# Patient Record
Sex: Male | Born: 1935 | Race: White | Hispanic: No | Marital: Married | Smoking: Former smoker
Health system: Southern US, Community
[De-identification: ages and names within clinical notes are randomized; demographics above are authoritative.]

## PROBLEM LIST (undated history)

## (undated) DIAGNOSIS — E039 Hypothyroidism, unspecified: Secondary | ICD-10-CM

## (undated) DIAGNOSIS — I2699 Other pulmonary embolism without acute cor pulmonale: Secondary | ICD-10-CM

## (undated) DIAGNOSIS — K635 Polyp of colon: Secondary | ICD-10-CM

## (undated) DIAGNOSIS — S52209A Unspecified fracture of shaft of unspecified ulna, initial encounter for closed fracture: Secondary | ICD-10-CM

## (undated) DIAGNOSIS — I4891 Unspecified atrial fibrillation: Secondary | ICD-10-CM

## (undated) DIAGNOSIS — I10 Essential (primary) hypertension: Secondary | ICD-10-CM

## (undated) DIAGNOSIS — E785 Hyperlipidemia, unspecified: Secondary | ICD-10-CM

## (undated) DIAGNOSIS — I639 Cerebral infarction, unspecified: Secondary | ICD-10-CM

## (undated) DIAGNOSIS — N183 Chronic kidney disease, stage 3 unspecified: Secondary | ICD-10-CM

## (undated) DIAGNOSIS — J449 Chronic obstructive pulmonary disease, unspecified: Secondary | ICD-10-CM

## (undated) DIAGNOSIS — I272 Pulmonary hypertension, unspecified: Secondary | ICD-10-CM

## (undated) DIAGNOSIS — C61 Malignant neoplasm of prostate: Secondary | ICD-10-CM

## (undated) HISTORY — DX: Hyperlipidemia, unspecified: E78.5

## (undated) HISTORY — DX: Chronic kidney disease, stage 3 unspecified: N18.30

## (undated) HISTORY — PX: APPENDECTOMY: SHX54

## (undated) HISTORY — PX: CHOLECYSTECTOMY: SHX55

## (undated) HISTORY — DX: Cerebral infarction, unspecified: I63.9

## (undated) HISTORY — DX: Unspecified fracture of shaft of unspecified ulna, initial encounter for closed fracture: S52.209A

## (undated) HISTORY — DX: Pulmonary hypertension, unspecified: I27.20

## (undated) HISTORY — DX: Unspecified atrial fibrillation: I48.91

## (undated) HISTORY — PX: SPLENECTOMY: SUR1306

## (undated) HISTORY — DX: Hypothyroidism, unspecified: E03.9

## (undated) HISTORY — DX: Chronic kidney disease, stage 3 (moderate): N18.3

## (undated) HISTORY — PX: OTHER SURGICAL HISTORY: SHX169

## (undated) HISTORY — DX: Malignant neoplasm of prostate: C61

## (undated) HISTORY — PX: CATARACT EXTRACTION: SUR2

## (undated) HISTORY — PX: PROSTATECTOMY: SHX69

## (undated) HISTORY — PX: PARTIAL COLECTOMY: SHX5273

## (undated) HISTORY — DX: Polyp of colon: K63.5

## (undated) HISTORY — PX: TOTAL HIP ARTHROPLASTY: SHX124

---

## 1999-07-20 ENCOUNTER — Ambulatory Visit (HOSPITAL_COMMUNITY): Admission: RE | Admit: 1999-07-20 | Discharge: 1999-07-20 | Payer: Self-pay | Admitting: Pulmonary Disease

## 1999-07-20 ENCOUNTER — Encounter: Payer: Self-pay | Admitting: Pulmonary Disease

## 1999-09-12 ENCOUNTER — Ambulatory Visit: Admission: RE | Admit: 1999-09-12 | Discharge: 1999-09-12 | Payer: Self-pay | Admitting: Pulmonary Disease

## 2001-09-23 DIAGNOSIS — I2699 Other pulmonary embolism without acute cor pulmonale: Secondary | ICD-10-CM

## 2001-09-23 HISTORY — DX: Other pulmonary embolism without acute cor pulmonale: I26.99

## 2008-06-27 ENCOUNTER — Inpatient Hospital Stay (HOSPITAL_COMMUNITY): Admission: RE | Admit: 2008-06-27 | Discharge: 2008-07-01 | Payer: Self-pay | Admitting: Orthopedic Surgery

## 2008-06-27 ENCOUNTER — Ambulatory Visit: Payer: Self-pay | Admitting: Emergency Medicine

## 2009-01-02 ENCOUNTER — Inpatient Hospital Stay (HOSPITAL_COMMUNITY): Admission: RE | Admit: 2009-01-02 | Discharge: 2009-01-05 | Payer: Self-pay | Admitting: Orthopedic Surgery

## 2009-01-10 ENCOUNTER — Encounter (INDEPENDENT_AMBULATORY_CARE_PROVIDER_SITE_OTHER): Payer: Self-pay | Admitting: *Deleted

## 2009-01-10 ENCOUNTER — Ambulatory Visit: Payer: Self-pay | Admitting: *Deleted

## 2009-01-10 ENCOUNTER — Inpatient Hospital Stay (HOSPITAL_COMMUNITY): Admission: EM | Admit: 2009-01-10 | Discharge: 2009-01-12 | Payer: Self-pay | Admitting: Emergency Medicine

## 2009-01-10 ENCOUNTER — Ambulatory Visit: Payer: Self-pay | Admitting: Vascular Surgery

## 2010-09-07 ENCOUNTER — Encounter
Admission: RE | Admit: 2010-09-07 | Discharge: 2010-09-07 | Payer: Self-pay | Source: Home / Self Care | Attending: Orthopedic Surgery | Admitting: Orthopedic Surgery

## 2011-01-02 LAB — TYPE AND SCREEN
ABO/RH(D): O NEG
ABO/RH(D): O NEG
Antibody Screen: NEGATIVE
Antibody Screen: NEGATIVE

## 2011-01-02 LAB — CBC
HCT: 22.9 % — ABNORMAL LOW (ref 39.0–52.0)
HCT: 25.4 % — ABNORMAL LOW (ref 39.0–52.0)
HCT: 27.4 % — ABNORMAL LOW (ref 39.0–52.0)
HCT: 27.5 % — ABNORMAL LOW (ref 39.0–52.0)
HCT: 34.2 % — ABNORMAL LOW (ref 39.0–52.0)
Hemoglobin: 7.9 g/dL — CL (ref 13.0–17.0)
Hemoglobin: 8.4 g/dL — ABNORMAL LOW (ref 13.0–17.0)
Hemoglobin: 11.6 g/dL — ABNORMAL LOW (ref 13.0–17.0)
Hemoglobin: 9.4 g/dL — ABNORMAL LOW (ref 13.0–17.0)
MCHC: 34.2 g/dL (ref 30.0–36.0)
MCHC: 34.4 g/dL (ref 30.0–36.0)
MCHC: 34.9 g/dL (ref 30.0–36.0)
MCV: 98.7 fL (ref 78.0–100.0)
MCV: 100 fL (ref 78.0–100.0)
MCV: 98.4 fL (ref 78.0–100.0)
MCV: 98.9 fL (ref 78.0–100.0)
MCV: 99.5 fL (ref 78.0–100.0)
Platelets: 341 10*3/uL (ref 150–400)
Platelets: 135 10*3/uL — ABNORMAL LOW (ref 150–400)
Platelets: 143 10*3/uL — ABNORMAL LOW (ref 150–400)
RBC: 2.43 MIL/uL — ABNORMAL LOW (ref 4.22–5.81)
RBC: 2.75 MIL/uL — ABNORMAL LOW (ref 4.22–5.81)
RBC: 3.42 MIL/uL — ABNORMAL LOW (ref 4.22–5.81)
RDW: 13.6 % (ref 11.5–15.5)
RDW: 13.9 % (ref 11.5–15.5)
RDW: 14.2 % (ref 11.5–15.5)
RDW: 13.9 % (ref 11.5–15.5)
WBC: 10.1 10*3/uL (ref 4.0–10.5)
WBC: 12.2 10*3/uL — ABNORMAL HIGH (ref 4.0–10.5)
WBC: 13.1 10*3/uL — ABNORMAL HIGH (ref 4.0–10.5)
WBC: 16.3 10*3/uL — ABNORMAL HIGH (ref 4.0–10.5)
WBC: 9.2 10*3/uL (ref 4.0–10.5)

## 2011-01-02 LAB — URINALYSIS, ROUTINE W REFLEX MICROSCOPIC
Bilirubin Urine: NEGATIVE
Glucose, UA: NEGATIVE mg/dL
Hgb urine dipstick: NEGATIVE
Nitrite: NEGATIVE
Nitrite: NEGATIVE
Protein, ur: NEGATIVE mg/dL
Specific Gravity, Urine: 1.011 (ref 1.005–1.030)
Specific Gravity, Urine: 1.01 (ref 1.005–1.030)
Urobilinogen, UA: 0.2 mg/dL (ref 0.0–1.0)
Urobilinogen, UA: 0.2 mg/dL (ref 0.0–1.0)
Urobilinogen, UA: 0.2 mg/dL (ref 0.0–1.0)
pH: 6 (ref 5.0–8.0)

## 2011-01-02 LAB — DIFFERENTIAL
Basophils Absolute: 0 10*3/uL (ref 0.0–0.1)
Basophils Absolute: 0 10*3/uL (ref 0.0–0.1)
Basophils Relative: 0 % (ref 0–1)
Basophils Relative: 1 % (ref 0–1)
Eosinophils Absolute: 0.2 10*3/uL (ref 0.0–0.7)
Lymphocytes Relative: 15 % (ref 12–46)
Lymphs Abs: 2.6 10*3/uL (ref 0.7–4.0)
Monocytes Absolute: 0.8 10*3/uL (ref 0.1–1.0)
Monocytes Absolute: 0.9 10*3/uL (ref 0.1–1.0)
Monocytes Relative: 10 % (ref 3–12)
Neutro Abs: 5.9 10*3/uL (ref 1.7–7.7)
Neutro Abs: 8.9 10*3/uL — ABNORMAL HIGH (ref 1.7–7.7)
Neutrophils Relative %: 70 % (ref 43–77)
Neutrophils Relative %: 80 % — ABNORMAL HIGH (ref 43–77)
Neutrophils Relative %: 58 % (ref 43–77)

## 2011-01-02 LAB — CREATININE, URINE, RANDOM: Creatinine, Urine: 119.3 mg/dL

## 2011-01-02 LAB — POCT CARDIAC MARKERS
CKMB, poc: 2.9 ng/mL (ref 1.0–8.0)
Myoglobin, poc: >500 ng/mL (ref 12–200)
Troponin i, poc: <0.05 ng/mL (ref 0.00–0.09)

## 2011-01-02 LAB — BASIC METABOLIC PANEL
BUN: 89 mg/dL — ABNORMAL HIGH (ref 6–23)
BUN: 28 mg/dL — ABNORMAL HIGH (ref 6–23)
BUN: 32 mg/dL — ABNORMAL HIGH (ref 6–23)
CO2: 23 mEq/L (ref 19–32)
CO2: 24 mEq/L (ref 19–32)
CO2: 24 mEq/L (ref 19–32)
CO2: 27 mEq/L (ref 19–32)
Calcium: 8 mg/dL — ABNORMAL LOW (ref 8.4–10.5)
Calcium: 8.1 mg/dL — ABNORMAL LOW (ref 8.4–10.5)
Calcium: 8.3 mg/dL — ABNORMAL LOW (ref 8.4–10.5)
Calcium: 8.7 mg/dL (ref 8.4–10.5)
Chloride: 111 mEq/L (ref 96–112)
Chloride: 104 mEq/L (ref 96–112)
Chloride: 105 mEq/L (ref 96–112)
Chloride: 108 mEq/L (ref 96–112)
Creatinine, Ser: 2.92 mg/dL — ABNORMAL HIGH (ref 0.4–1.5)
Creatinine, Ser: 3.24 mg/dL — ABNORMAL HIGH (ref 0.4–1.5)
Creatinine, Ser: 1.73 mg/dL — ABNORMAL HIGH (ref 0.4–1.5)
GFR calc Af Amer: 22 mL/min — ABNORMAL LOW (ref >60)
GFR calc Af Amer: 37 mL/min — ABNORMAL LOW (ref >60)
GFR calc Af Amer: 46 mL/min — ABNORMAL LOW (ref >60)
GFR calc Af Amer: 46 mL/min — ABNORMAL LOW (ref >60)
GFR calc non Af Amer: 21 mL/min — ABNORMAL LOW (ref >60)
GFR calc non Af Amer: 31 mL/min — ABNORMAL LOW (ref >60)
GFR calc non Af Amer: 38 mL/min — ABNORMAL LOW (ref >60)
Glucose, Bld: 107 mg/dL — ABNORMAL HIGH (ref 70–99)
Glucose, Bld: 117 mg/dL — ABNORMAL HIGH (ref 70–99)
Glucose, Bld: 140 mg/dL — ABNORMAL HIGH (ref 70–99)
Glucose, Bld: 116 mg/dL — ABNORMAL HIGH (ref 70–99)
Glucose, Bld: 151 mg/dL — ABNORMAL HIGH (ref 70–99)
Potassium: 4.8 mEq/L (ref 3.5–5.1)
Potassium: 4.8 mEq/L (ref 3.5–5.1)
Potassium: 4.8 mEq/L (ref 3.5–5.1)
Potassium: 4.9 mEq/L (ref 3.5–5.1)
Potassium: 4 mEq/L (ref 3.5–5.1)
Potassium: 4.3 mEq/L (ref 3.5–5.1)
Potassium: 4.4 mEq/L (ref 3.5–5.1)
Potassium: 4.8 mEq/L (ref 3.5–5.1)
Sodium: 139 mEq/L (ref 135–145)
Sodium: 139 mEq/L (ref 135–145)
Sodium: 141 mEq/L (ref 135–145)
Sodium: 139 mEq/L (ref 135–145)
Sodium: 140 mEq/L (ref 135–145)
Sodium: 141 mEq/L (ref 135–145)

## 2011-01-02 LAB — SODIUM, URINE, RANDOM: Sodium, Ur: 18 mEq/L

## 2011-01-02 LAB — HEPATIC FUNCTION PANEL
AST: 34 U/L (ref 0–37)
Albumin: 2.8 g/dL — ABNORMAL LOW (ref 3.5–5.2)
Bilirubin, Direct: 0.1 mg/dL (ref 0.0–0.3)

## 2011-01-02 LAB — URINE MICROSCOPIC-ADD ON

## 2011-01-02 LAB — LIPID PANEL
Cholesterol: 85 mg/dL (ref 0–200)
HDL: 23 mg/dL — ABNORMAL LOW (ref >39)

## 2011-01-02 LAB — PROTIME-INR
INR: 1.2 (ref 0.00–1.49)
INR: 1.3 (ref 0.00–1.49)
INR: 1.4 (ref 0.00–1.49)
INR: 1 (ref 0.00–1.49)
Prothrombin Time: 16 seconds — ABNORMAL HIGH (ref 11.6–15.2)
Prothrombin Time: 16.3 seconds — ABNORMAL HIGH (ref 11.6–15.2)

## 2011-01-02 LAB — WOUND CULTURE
Culture: NO GROWTH
Gram Stain: NONE SEEN

## 2011-01-02 LAB — CULTURE, BLOOD (ROUTINE X 2): Culture: NO GROWTH

## 2011-01-02 LAB — APTT: aPTT: 30 seconds (ref 24–37)

## 2011-01-02 LAB — TSH: TSH: 2.136 u[IU]/mL (ref 0.350–4.500)

## 2011-01-02 LAB — BRAIN NATRIURETIC PEPTIDE: Pro B Natriuretic peptide (BNP): 41 pg/mL (ref 0.0–100.0)

## 2011-02-05 NOTE — Discharge Summary (Signed)
NAME:  Manuel [name], Manuel [name] NO.:  [id number]   MEDICAL RECORD NO.:  [id number]          PATIENT TYPE:  INP   LOCATION:  6711                         FACILITY:  [facility]   PHYSICIAN:  Waldemar Dickens, MD     DATE OF BIRTH:  Jan 07, 1922   DATE OF ADMISSION:  01/10/2009  DATE OF DISCHARGE:  01/12/2009                               DISCHARGE SUMMARY   DISCHARGE DIAGNOSIS:  1. Right leg swelling/cellulitis, status post right hip arthroplasty      on January 02, 2009, resolving, to finish a 10-day course of      doxycycline.  2. Anemia secondary to blood loss from the recent surgery, in the      setting of chronic anemia with baseline hemoglobin between 11-12,      hemoglobin at the time of discharge 8.4, to follow up with his PCP.  3. Acute on chronic renal insufficiency, resolving, the patient is to      hold ACE inhibitors, ARBs, Lasix until he sees his primary care      physician, to follow up to get repeat BMP on Monday.  4. Status post right hip arthroplasty, January 02, 2009 by Dr. Turner Daniels, on      Coumadin therapy for DVT prophylaxis.  5. Status post left hip arthroplasty, October 2009 done by Dr. Turner Daniels.  6. Coronary artery disease, stable.  7. Hypertension, ACE inhibitor, ARBs, and Lasix are held and      amlodipine continued.  8. COPD, on home oxygen.  9. History of DVT.  10.History of pulmonary emboli status post IVC filter placement nearly      5 years ago.  11.History of prostate cancer status post surgery.  12.History of cholecystectomy.  13.Hyperlipidemia with an LDL of 47 and HDL of 23.  [id number].   DISCHARGE MEDICATIONS:  1. Doxycycline 100 mg 1 pill twice daily for a total of 10 days.  2. Synthroid 50 mcg 1 pill once daily.  3. Simvastatin 80 mg 1 pill once daily.  4. Centrum Silver 1 pill once daily.  5. Fexofenadine 180 mg 1 pill once daily.  6. Prevacid 30 mg 1 pill once daily.  7. Brovana 15 mcg/ 2 mL nebulization twice daily.  8. Coumadin 4 mg  1 pill once daily, the patient is to check PT/INR on      January 16, 2009 at his PCP.  9. Oxygen via nasal cannula as needed.  10.Oxycodone 5 mg 1 pill every 4 hours as needed for hip pain or leg      pain.  11.Amlodipine 5 mg 1 pill once daily.  12.The patient is advised to stop taking fosinopril until he sees his      primary care physician.  13.The patient is advised not to take furosemide until he sees his      primary care physician.  14.The patient is advised not to take Exforge until he sees his      primary care physician.   The patient is advised to elevate his right leg and increase activity  slowly and is advised to use walker.   DISPOSITION:  Follow-up.  The patient is to follow up with Dr. Foye Deer on  January 16, 2009 at 3:15 p.m.  The patient is to get CBC, BMET, and INR on  the day of appointment.  At the time of hospital follow-up, please make  sure that the patient's hemoglobin is not dropping further.  The  patient's hemoglobin on the day of discharge is 8.4 and was stable.  Make sure that the cellulitis of his right leg is resolving and the  infection is getting better.  Please check his INR and adjust his  Coumadin as needed and also check his BMET to make sure that his BUN and  creatinine are back to his baseline.  His BUN at the time of discharge  is 52 and his creatinine is 1.73.  If the BUN and creatinine are back to  his baseline,  may consider restarting his ACE inhibitor, ARBs, and  Lasix.  The patient is to follow up with his orthopedic doctor Dr. Turner Daniels  as needed.   PROCEDURES PERFORMED:  Right lower extremity Doppler studies are negative.  Chest x-ray, borderline cardiomegaly. Low lung volume suspected  atelectasis medially at the right lung base.  Right hip x-ray. Bilateral total hip replacements with no evidence of  complication.  The surgical staples remain in place, no fractures, no  dislocations no evidence of hardware complications.  The  patient's BNP  on the day of admission is 41.  CBC on the day of admission; white count  11.2, hemoglobin 9.3, hematocrit of 26.6, platelet count 341.  BMET on  the day of admission; sodium 137, potassium 5.21, chloride 104, bicarb  24, glucose 117, BUN 95, creatinine 3.24, calcium 8.7.  Fasting lipid  panel; total cholesterol 85, HDL cholesterol 23, LDL cholesterol 47,  VLDL cholesterol 15.  TSH is 2.113. Hemoglobin A1c 5.5.  Urinalysis is  negative for nitrates and leukocytes.  Preliminary wound cultures and  blood cultures are negative.   HISTORY OF PRESENT ILLNESS:  The patient is an 77 year old gentleman with a past medical history  significant for hypertension, hypothyroidism, chronic renal  insufficiency with creatinine ranging from 1.5 to 2.0, a history of PE  around 5 years ago status post IVC filter placement, history of recent  right hip replacement on January 02, 2009, comes to the emergency with  gradual worsening of his right lower extremity edema.  The patient was  discharged home on January 05, 2009 with home health physical therapy and  occupational therapy.  But patient has been noticing gradual worsening  of his right lower extremity edema.  The patient denies any fever, body  aches, nausea, vomiting, or chest pain.  The patient's wife reports mild  worsening of his shortness of breath.  The patient reports pain in his  right leg, 4/10 in severity, nonradiating, constant, increased by  weightbearing and not allowing him to do his physical therapy.  The  patient on the day prior to admission noticed a blister over the medial  aspect of his right lower leg that started weeping later in the day.  His orthopedic doctor on the day of admission was told to go to the  [facility].  His family reports that he is not drinking water  as he is supposed to.  The patient reports that he does have mild  swelling of his ankles bilaterally for a long time.   PHYSICAL  EXAMINATION:  VITAL SIGNS:  Temperature 98.2, blood pressure 113/47, pulse rate of  74,  respiratory 16, oxygen saturation 90% on room air.  GENERAL EXAMINATION:  The patient was not in any acute distress lying on  the bed.  HEENT:  Eyes:  Pupils equal, round and reacting to light.  ENT: Moist  mucous membranes.  No erythema.  NECK:  Neck is supple.  No JVD.  RESPIRATORY EXAMINATION:  Clear to auscultation bilaterally.  Good air  movement.  CARDIAC EXAMINATION:  S1 and S2 regular in rate and rhythm.  There is  2/6 systolic murmur in the mitral area radiating to the axilla. No rubs,  no gallops.  GI EXAMINATION:  Abdomen is soft, nondistended, and nontender.  No  guarding or rigidity.  Bowel sounds are positive.  Extremities:  Right leg is diffusely swollen from ankle to the hip, leg  erythematous, bullae noted at the medial aspect of his right lower leg,  around 1-2 cms in size, oozing clear fluid.  There is 2+ pitting edema  in the right leg and 1+ pitting edema on the left leg.  No drainage from  the surgical site.  The right leg is warm to touch.  NEUROLOGICAL EXAMINATION:  Limited in the right lower extremity because  of the pain.  Otherwise motor strength is 5/5 in all the extremities.  PSYCH:  Appropriate.   HOSPITAL COURSE:  1. Right leg cellulitis. The patient was discharged from the hospital      on January 05, 2009 after he had a right hip arthroplasty on January 02, 2009.  The patient does have a baseline swelling in his ankles      bilaterally, but he was having mild edema of his right lower      extremity after the surgery and he does have a white count from 13-      16 prior to discharge from the hospital.  Given his erythema,      warmth, edema and his worsening cellulitis we started him on oral      antibiotic doxycycline 100 mg p.o. b.i.d. for a total of 10 days.      We asked him to elevate his legs and the edema gradually started      resolving.  Blood cultures,  urine cultures and wound cultures are      negative as per the preliminary reports.  He is to finish full      course of the doxycycline and is to follow up with his primary care      physician Dr. Tomasa Blase on Monday 25, 2010 at 3:15 p.m. The patient      is also advised to follow up with Dr. Turner Daniels as needed and as      advised.   1. Anemia.  The patient's hemoglobin on the day of admission was 9.3      and he was in prerenal failure with a creatinine of 3.2.  After we      gave him fluids his hemoglobin drop to 7.9, most likely secondary      to dilution.  But his hemoglobin remained stable during the      remaining part of hospital stay with a value of 8.4 at the time of      discharge.  The patient is to follow up with his primary care      doctor and is to get a CBC on January 16, 2009, Monday, to make sure      that his hemoglobin is not falling.  Given the patient's history of      history of DVT, PE, history of recent hip replacement the patient      is at  high risk for DVTs so we continued his Coumadin.  The      patient is to get a PT/INR at the time of hospital follow-up with      Dr. Tomasa Blase and his Coumadin needs to be adjusted accordingly.   1. Acute on chronic renal insufficiency.  The patient does have      chronic renal insufficiency with a baseline creatinine ranging from      1.7 to 1.9.  At the time of admission his creatinine was 3.2 and as      per patient's wife, he was not drinking water atleast few days      prior to admission, on top of which he was taking ACE inhibitors,      angiotensin blockers and Lasix for his blood pressure control.      Given his poor p.o. intake and ACE inhibitor, ARBs, Lasix his acute      on chronic renal insufficiency is most likely secondary to his      prerenal condition and also his BUN creatinine ratio was greater      than 20 on admission.  We started him on IV fluids and held his ACE      inhibitor, ARBs, Lasix.  The patient's  creatinine gradually kept      coming down and on the day of discharge his creatinine is 1.73      which is close to his baseline.  The patient is to follow up with      his primary care physician on January 16, 2009 and is to get a repeat      BMP to make sure that the creatinine is raising anymore.  If the      BUN and creatinine are back to his baseline, we recommend      restarting his ACE inhibitors and Lasix.   1. Hypertension.  The patient's blood pressure during the initial      couple of days of admission was fairly well controlled without any      of his medications on board.  We held his ACE inhibitors, ARBs, and      Lasix secondary to his prerenal azotemia.  On the day of admission      as his systolic blood pressures were touching the 150s we started      him on amlodipine 5 mg.  The patient is to follow up with his      primary care physician and he is advised not to take ACE inhibitor,      ARBs, and Lasix until he sees the primary care physician and is      advised to take amlodipine 5 mg once a day..  Given the acute renal      insufficiency is resolved at the time of hospital follow-up may      consider restarting his ACE inhibitors and Lasix.   1. COPD, stable.  2. Hyperlipidemia. We continued his home medication.  3. Coronary artery disease, stable.   Vitals on the day of discharge, temperature 97.9, pulse rate of 67,  respiratory 20, blood pressure 151/62, oxygen saturation 96% on 3  liters.   Labs on the day of discharge; CBC:  White count 10.1, hemoglobin 8.4,  hematocrit 24.0, platelet count 400.  Basic metabolic panel:  Sodium  141, potassium 4.8, chloride 114, bicarb 23, glucose 112, BUN 52,  creatinine 1.70, calcium 8.3.   On the day of discharge the patient is alert and oriented x3, stable,  asymptomatic, is not in any distress.   The patient is to follow up with his primary care physician Dr. Tomasa Blase  on January 16, 2009 at 3:15 p.m. to get a repeat CBC,  basic metabolic  panel and PT/INRs.      Blondell Reveal, MD  Electronically Signed      Waldemar Dickens, MD  Electronically Signed    VB/MEDQ  D:  01/12/2009  T:  01/12/2009  Job:  [id number]   cc:   Foye Deer, MD  Feliberto Gottron. Turner Daniels, M.D.

## 2011-02-05 NOTE — Op Note (Signed)
NAME:  Manuel [name], Manuel [name] NO.:  [id number]   MEDICAL RECORD NO.:  [id number]          PATIENT TYPE:  INP   LOCATION:  2603                         FACILITY:  [facility]   PHYSICIAN:  Feliberto Gottron. Turner Daniels, M.D.   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   PROCEDURE:  Right total hip arthroplasty using DePuy, 52-mm ASR, and  NK+5 ultimate, #4 Summit stem with a 13-mm tip and a #3 cement  restricter.  Double batch of DePuy, one cement with 1500 mg of Zinacef.   SURGEON:  Feliberto Gottron. Turner Daniels, MD   FIRST ASSISTANT:  [name], PA-C   ANESTHETIC:  General endotracheal.   ESTIMATED BLOOD LOSS:  300 mL.   FLUID REPLACEMENT:  1200 mL of crystalloid.   DRAINS PLACED:  Foley catheter.   URINE OUTPUT:  300 mL.   INDICATIONS FOR PROCEDURE:  An 77 year old male with bilateral hip AVN,  who underwent a successful left total hip arthroplasty back in September  2009, with basically the exact same components and now desires right  total hip arthroplasty to decrease pain and increase function.  He has  AVN of the right hip with a collapse of the femoral head.  Risks and  benefits of the surgery discussed, and well-known to the patient.   DESCRIPTION OF PROCEDURE:  The patient identified by armband and  received preoperative antibiotics in the holding area at [facility].  He was then taken to operating room 5, appropriate site  monitors were attached and general endotracheal anesthesia induced with  the patient in supine position.  He was then rolled into the left  lateral decubitus position fixed it with a Hulbert Mark II pelvic clamp,  after a Foley catheter was inserted and the right lower extremity  prepped, draped in sterile fashion from the ankle to the hemipelvis.  Standard time-out procedure performed and then the lateral hip and thigh  were infiltrated with 20 mL of 0.5% Marcaine and epinephrine solution  along the track of a posterolateral approach to the hip.  We then made a  15 cm incision centered over the greater trochanter allowing  posterolateral approach to the hip joint to the skin and subcutaneous  tissue down to the level of the IT band, which was then cut in line with  the skin incision over the greater trochanter.  We split the fibers of  the tensor fascia lata exposing the greater trochanter.  Cobra  retractors were then placed between the gluteus minimus and the inferior  hip joint capsule in between the gluteus minimus and the superior hip  joint capsule in the quadratus femoris and the inferior hip joint  capsule.  This exposed the piriformis and short external rotators, which  were then tagged with a #2 Ethibond suture and cut off at the insertion  on  the intertrochanteric crest exposing the hip joint capsule, which was  then developed into an acetabular based flap going from posterior-  superior out over the femoral neck and then out inferior-posterior and  taken this flap to #2 Ethibond sutures.  This exposed the femoral head,  which did have of collapse from AVN.  Bleeders along the  intertrochanteric crest were cauterized.  The hip was flexed and  internally rotated dislocating the femoral head and a standard neck cut  performed one fingerbreadth above the lesser trochanter with the  oscillating saw at the same angle as the stem.  The proximal femur was  then translated anteriorly using a Hohmann retractor for lowering off  the anterior column.  A cobra retractor spiked and was placed in the  cotyloid notch and the posterior inferior wing retractor was placed in  the junction of the ischium and the acetabulum.  This allowed [place] to  excise the labrum with the  electrocautery.  We then reamed the  acetabulum up to 51-mm basket reamer obtaining good coverage in all  quadrants and then irrigated out with normal saline solution.  The edge  was then reamed with 52 mm reamer and a 52 mm ASR cup was inserted in 45  degrees of abduction and 20 degrees of anteversion obtaining good fit  and fill with a solid locking to the acetabulum.  Peripheral osteophytes  were then removed, it was flexed, internally rotated exposing the  proximal femur, which was then entered with the initiating reamer  followed by the lateral reamer and tapered reaming up to a 5.4 taper  reamer and broaching up to a #4 broach.  This was the same size used on  the contralateral side.  We lightly milled the calcar, performed a trial  reduction with NK+2 and NK+5 trial heads obtaining excellent stability  with a +5 to flexion of 90 internal rotation at 70, and external  rotation and extension, the hip could not be dislocated.  At this point,  the trial components were removed, a #3 cement restricter was hammered  in the appropriate depth and the femoral canal, which was then water  picked clean and dry with suction sponges.  A double batch of DePuy one  cement with 1500 mg of Zinacef was then mixed and inserted into the  femoral canal under pressure followed by a #4 Summit stem in about 20  degrees of anteversion in relation to the tibia.  Excess cement was  removed as the cement cured, the stem was held in position.  An NK+5  ultimate head was then hammered onto the stem.  The hip once again  reduced and stability noted be excellent.  At this point, the wound was  once again irrigated out with normal saline solution.  The capsular flap  and a short external rotators were repaired back to the  intertrochanteric crest through drill holes with a #2 Ethibond suture.  The IT band closed with running #1 Vicryl suture.  The subcutaneous  tissue with 0 and 2-0 undyed Vicryl suture and the  skin with running  interlocking 3-0 nylon suture.  A dressing of Xeroform and Mepilex was  then applied.  The patient was unclamped, rolled supine, awakened and  taken to recovery room without difficulty.      Feliberto Gottron. Turner Daniels, M.D.  Electronically Signed     FJR/MEDQ  D:  01/02/2009  T:  01/03/2009  Job:  [id number]

## 2011-02-05 NOTE — Discharge Summary (Signed)
NAME:  Manuel [name], Manuel [name] NO.:  [id number]   MEDICAL RECORD NO.:  [id number]          PATIENT TYPE:  INP   LOCATION:  5001                         FACILITY:  [facility]   PHYSICIAN:  Feliberto Gottron. Turner [name], M.D.   DATE OF BIRTH:  Jan 29, 1922   DATE OF ADMISSION:  06/27/2008  DATE OF DISCHARGE:  07/01/2008                               DISCHARGE SUMMARY   CHIEF COMPLAINT:  Left hip pain.   HISTORY OF PRESENT ILLNESS:  This is an 77 year old gentleman who  complains of unremitting pain in his left hip despite conservative  treatment with antiinflammatory medicines, narcotic pain medicines, and  steroid injections.  He desires a surgical intervention at this time.  All risks and benefits of surgery were discussed with the patient.   PAST MEDICAL HISTORY:  Significant for:  1. Coronary artery disease.  2. Hypertension.  3. COPD.  He is oxygen-dependent.  4. Renal insufficiency.  5. Hyperlipidemia.  6. DVT.  7. Pulmonary embolism status post IVC filter placement.   PAST SURGICAL HISTORY:  Significant for:  1. Prostate surgery.  2. Cholecystectomy.   SOCIAL HISTORY:  He quit smoking 50 years ago.  He denies alcohol use.   FAMILY HISTORY:  Noncontributory.   ALLERGIES:  He has no known drug allergies.   CURRENT MEDICATIONS:  1. Synthroid 0.05 mg 1 p.o. daily.  2. Prevacid 30 mg 1 p.o. daily.  3. Fexofenadine 180 mg 1 p.o. daily.  4. Furosemide 40 mg 1 p.o. daily.  5. Fosinopril 40 mg 1 p.o. daily.  6. Zetia 10 mg 1 p.o. daily.  7. Amlodipine 10 mg 1 p.o. daily.  8. Brovana 15 mcg/Sepracor 1 p.o. b.i.d.   PHYSICAL EXAMINATION:  Gross examination of the left hip demonstrates  the patient to have tenderness with internal rotation and a positive  foot tap.  He walks with an antalgic gait.  He is neurovascularly  intact.   X-rays demonstrate end-stage degenerative joint disease of the left hip.   PREOPERATIVE LABORATORY DATA:  White blood cells 8.1, red blood cells  3.54, hemoglobin 11.9, hematocrit 35.4, platelets 209, sodium 142,  potassium 4.0, chloride 109, glucose 103, BUN 29, creatinine 1.62, PT  13.3, INR 1.0, PTT 29.  Urinalysis was within normal limits.   HOSPITAL COURSE:  Manuel [name] was admitted to [facility] on  June 27, 2008, when he underwent a left total hip arthroplasty using  the DePuy system.  A perioperative Foley catheter was placed as the  surgery was performed by Manuel [name], and the patient tolerated the  procedure well.  He was placed on a Stepdown Unit for close monitoring.  On the first postoperative day, the patient was awake and alert.  He  reported no shortness of breath on 3 L of oxygen.  His surgical dressing  was clean and dry.  He was transferred to the Orthopedic Unit where he  was maintained on 3 L of oxygen.  He was evaluated by Physical Therapy  on this day and was able to ambulate 30 feet.  On the second  postoperative day, the patient complained of  constipation.  He continued  to deny any shortness of breath.  He was again able to ambulate 30 feet  with Physical Therapy.  His hemoglobin was 9.5.  On the third  postoperative day, the patient's hemoglobin was 8.8, and he complained  of fatigue and shortness of breath that made his physical therapy  difficult.  He reported that he was able to have a bowel movement on the  previous day after suppository.  His creatinine had increased to 2.15,  so he was given IV fluids.  He was also given 1 unit of packed red blood  cells.  On the fourth postoperative day, the patient's hemoglobin had  increased to 9.9, and his creatinine had decreased to 1.88.  He was  eating well and ambulating independently with physical therapy.  He was  able to walk 200 feet with therapy.  He was passing stool and flatus  without difficulty.  His incision was benign, and his surgical dressing  was partially soaked with dry blood.  He was discharged home on this  day.    DISPOSITION:  The patient was discharged home on July 01, 2008.  Manuel [name] will manage his wound, Coumadin, and Physical Therapy.  He will  return to the clinic to see Manuel [name] in 1 week.  He is weightbearing as  tolerated with his walker.  His discharge medicines will be as per the  HMR with the addition of Percocet 5 mg tablet 1-2 tab p.o. q.4 h. p.r.n.  pain and Coumadin 5 mg tablet take as directed.   FINAL DIAGNOSIS:  Left hip end-stage degenerative joint disease.   SECONDARY DIAGNOSES:  1. Acute blood loss anemia.  2. Acute renal failure.      Manuel Harris, PA      Feliberto Gottron. Turner [name], M.D.  Electronically Signed    JW/MEDQ  D:  07/01/2008  T:  07/02/2008  Job:  [id number]

## 2011-02-05 NOTE — Op Note (Signed)
NAME:  Manuel [name], Manuel [name] NO.:  [id number]   MEDICAL RECORD NO.:  [id number]          PATIENT TYPE:  INP   LOCATION:  NA                           FACILITY:  [facility]   PHYSICIAN:  Feliberto Gottron. Turner Daniels, M.D.   DATE OF BIRTH:  01-23-1922   DATE OF PROCEDURE:  DATE OF DISCHARGE:                               OPERATIVE REPORT   PREOPERATIVE DIAGNOSIS:  End-stage arthritis of left hip secondary to  avascular necrosis.   POSTOPERATIVE DIAGNOSIS:  End-stage arthritis of left hip secondary to  avascular necrosis.   PROCEDURE:  Left total hip arthroplasty, hybrid using a DePuy 52-mm ASR  shell, NK +0 46-mm Ultimate head, #4 Summit cemented stem with a 12-mm  tip, and a #3 cement restrictor.  Double batch of DePuy HV cement with  1500 mg of Zinacef mixed in the cement.   SURGEON:  Feliberto Gottron.  Turner Daniels, MD   FIRST ASSISTANT:  [name], PA-C   ANESTHETIC:  General endotracheal.   ESTIMATED BLOOD LOSS:  250 mL.   FLUID REPLACEMENT:  1500 mL of crystalloid.   DRAINS:  Foley catheter.   URINE OUTPUT:  300 mL.   ANTIBIOTICS:  He did receive 1 gram of Ancef preoperatively.   INDICATIONS FOR PROCEDURE:  An 77 year old man with end-stage arthritis  of his left hip secondary to AVN who has failed conservative treatment  with a walker, anti-inflammatory medicines, narcotics, attempts at  exercises, and plain radiographs show subchondral collapse of the  femoral head and fairly global AVN.  He desires elective left total hip  arthroplasty.  Risks and benefits of surgery discussed, questions  answered where he a hybrid design because of his age and in an attempt  to minimize his blood loss.  He is an oxygen-dependent COPD patient who  also has a history of DVTs and has an IVC filter in place and carries  significant perioperative risk.   DESCRIPTION OF PROCEDURE:  The patient was identified by armband, taken  the operating room at [facility] where the appropriate site  monitors were attached and general endotracheal anesthesia induced with  the patient in supine position.  A Foley catheter was inserted.  He  received preoperative IV antibiotics and then was placed in the right  lateral decubitus position and fixed there with a Stulberg Mark II  pelvic clamp.  The left lower extremity was then prepped and draped in  the usual sterile fashion from the ankle to the hemipelvis.  The skin  along the lateral hip and thigh infiltrated with 20 mL of 0.5% Marcaine  and epinephrine solution and a time-out procedure was performed.  We  then made a posterolateral approach to the hip joint centered over the  greater trochanter about 15 cm in length cutting through the skin and  subcutaneous tissue down to the IT band which was then cut in line with  the skin incision.  This exposed the greater trochanter and a Cobra  retractor was placed between the gluteus minimus and superior hip joint  capsule and between the quadratus femoris and the inferior hip joint  capsule.  This isolated the piriformis and short external rotators which  were then cut off their insertion on the intertrochanteric crest  exposing the posterior aspect of the hip joint capsule.  This was  likewise developed into an acetabular based flap going from posterior-  superior on the acetabulum out over the insertion on the femoral neck  and then down inferiorly and back out posteriorly to the acetabulum.  This was tagged with two #2 Ethibond sutures.  The hip was flexed and  internally rotated dislocating the arthritic femoral head which did have  some subchondral collapse of the bone.  A standard neck cut was then  performed 1 fingerbreadth above the lesser trochanter with the  oscillating saw and the femoral head was removed.  The proximal femur  was then translated anteriorly in relation to the pelvis levering off  the anterior column with the superior Homan retractor.  A Cobra  retractor was placed  in the cotyloid notch and a posterior inferior wing  retractor was placed.  This exposed the labrum which was then cut off  from the insertion on the acetabular rim and we then sequentially reamed  up to a 51-mm basket reamer obtaining good coverage in all quadrants and  just touched the lip of the acetabulum to 52 reamer.  The acetabulum was  then irrigated out with normal saline solution and a 52-mm ASR cup was  hammered into place obtaining good fit and fill in all quadrants.  The  hip was flexed and internally rotated exposing the proximal femur which  was then entered with the initiating reamer followed by the lateral  reamer and axial reaming up to a 4-5 axial reamer followed by broaching  up to a #4 broach which had the correct size fit and fill for the femur.  With the broach in place, a standard neck was then placed on the broach  and an NK +0 46-mm trial was placed on the broach and neck and the hip  reduced.  Stability of 90 degrees flexion with 70 of internal rotation  was noted in extension with the knee flexed.  The patient could not be  dislocated in external rotation.  At this point, the trial components  were removed.  We sized for a #3 cement restrictor which was then  inserted to the appropriate depth and the proximal femur was then pulse  lavaged, cleaned, and dried with suction and sponges.  Meanwhile a  double batch of DePuy HV cement was mixed with 1500 mg of Zinacef and  loaded into the cement gun and injected into the proximal femur under  pressure followed by a #4 Summit stem.  Excess cement was removed.  The  stem was held in about 20 degrees of anteversion in relation of the  epicondylar axis and after the cement had cured, an NK +0 46-mm Ultimate  ball was hammered on the stem and the hip once again reduced and  stability noted to be excellent.  The wound was then pulse lavaged with  irrigation.  The capsular flap and short external rotators were repaired  back  to the intertrochanteric crest through drill holes with a #2  Ethibond suture.  The IT band was closed with running #1 Vicryl suture,  the subcutaneous tissue with 0 and 2-0 undyed Vicryl suture, and the  skin with running interlocking 3-0 nylon suture.  A dressing of Xeroform  and Mepilex was then applied.  The patient was unclamped, rolled supine,  awakened, and taken to the recovery room without difficulty.      Feliberto Gottron. Turner Daniels, M.D.  Electronically Signed     FJR/MEDQ  D:  06/27/2008  T:  06/27/2008  Job:  [id number]

## 2011-02-08 NOTE — Discharge Summary (Signed)
NAME:  Manuel [name], Manuel [name] NO.:  [id number]   MEDICAL RECORD NO.:  [id number]          PATIENT TYPE:  INP   LOCATION:  5037                         FACILITY:  [facility]   PHYSICIAN:  Feliberto Gottron. Turner Daniels, M.D.   DATE OF BIRTH:  Feb 23, 1922   DATE OF ADMISSION:  01/02/2009  DATE OF DISCHARGE:  01/05/2009                               DISCHARGE SUMMARY   CHIEF COMPLAINT:  Right hip pain.   HISTORY OF PRESENT ILLNESS:  This is an 77 year old gentleman who  complains of severe unremitting pain in his right hip despite  conservative treatment with steroid injections, narcotics, and anti-  inflammatories.  He desires a surgical intervention at this time.  All  risks and benefits of surgery were discussed with the patient's.   PAST MEDICAL HISTORY:  1. Coronary artery disease.  2. Hypertension.  3. COPD.  4. Renal insufficiency.  5. Hyperlipidemia.  6. DVT.  7. Pulmonary embolism.   PAST SURGICAL HISTORY:  He had a left total hip arthroplasty in 2009.  He has had prostate surgery and cholecystectomy.   SOCIAL HISTORY:  He quit smoking 50 years ago and denies the use of  alcohol.   FAMILY HISTORY:  Noncontributory.   ALLERGIES:  He has no known drug allergies.   CURRENT MEDICATIONS:  1. Synthroid 0.5 mg one p.o. daily.  2. Prevacid 30 mg one p.o. daily.  3. Fexofenadine 180 mg one p.o. daily.  4. Furosemide 80 mg one p.o. daily.  5. Simvastatin 80 mg one p.o. daily.  6. Exforge 5 mg/160 mg one p.o. daily.  7. Fosinopril 40 mg one p.o. daily.  8. Brovana 15 mcg one puff b.i.d.  9. Bumetone 500 mg one p.o. b.i.d.  10.Vitamin B12 injection twice monthly.   PHYSICAL EXAMINATION:  Gross examination of the right hip demonstrates  that the patient has tenderness with internal and external rotation.  He  has a positive foot tap and is neurovascularly intact.  X-rays  demonstrates bone-on-bone degenerative joint disease of the right hip.   PREOP LABS:  White blood cells 9.2,  red blood cells 3.42, hemoglobin  11.6, hematocrit 34.2, platelets 200.  PT 12.8, INR 1.0, PTT 30.  Sodium  141, potassium 4.3, chloride 104, glucose 96, BUN 32, creatinine 1.73.  Urinalysis was within normal limits.   HOSPITAL COURSE:  Manuel [name] was admitted to [facility] on January 02, 2009 when he underwent right total hip arthroplasty.  The procedure  was performed by Dr. Gean Birchwood and the patient tolerated it well.  A  perioperative Foley catheter was placed.  The patient was transferred to  the Orthopedic floor in good condition and placed on Lovenox and  Coumadin for DVT prophylaxis.  On the first postoperative day, the  patient was awake and alert and tolerating p.o. intake.  He reported  that his hip pain was well controlled.  Hemoglobin was 9.3 and surgical  dressing was partially soaked.  He was transferred from the step-down to  the Orthopedic floor.  Foley catheter was discontinued.  On the second  postoperative day, the patient was tolerating  p.o. intake and passing  stool and flatus.  He was working well with physical therapy.  Hemoglobin was 8.8 and he complained of some shortness of breath, so he  was transfused with 1 unit of packed red blood cells.  On the third  postoperative day, the patient was doing well and progressing well with  physical therapy.  He passed all of his goals and was discharged home.   DISPOSITION:  The patient was discharged on January 05, 2009.   The discharge medicines were as per the HMR with the addition of Vicodin  5 mg one p.o. q.4-6 h. p.r.n. pain and Coumadin dosed by the pharmacy to  take as directed with a target INR of 1.5 to 2.  He was weightbearing as  tolerated and will return to the clinic in 7-10 days for x-rays and  suture removal.  Home health care would manage his wound, Coumadin, and  physical therapy.   FINAL DIAGNOSIS:  End-stage degenerative joint disease of the right hip  with a secondary diagnosis of acute blood  loss anemia.      [name], PA      Feliberto Gottron. Turner Daniels, M.D.  Electronically Signed    JW/MEDQ  D:  03/24/2009  T:  03/24/2009  Job:  [id number]

## 2011-06-24 LAB — TYPE AND SCREEN: ABO/RH(D): O NEG

## 2011-06-24 LAB — CBC
HCT: 35.4 — ABNORMAL LOW
Hemoglobin: 11.9 — ABNORMAL LOW
MCHC: 33.6
MCV: 100
RBC: 3.54 — ABNORMAL LOW

## 2011-06-24 LAB — COMPREHENSIVE METABOLIC PANEL
BUN: 29 — ABNORMAL HIGH
CO2: 26
Calcium: 9
Chloride: 109
Creatinine, Ser: 1.62 — ABNORMAL HIGH
GFR calc Af Amer: 49 — ABNORMAL LOW
GFR calc non Af Amer: 41 — ABNORMAL LOW
Glucose, Bld: 103 — ABNORMAL HIGH
Total Bilirubin: 0.6

## 2011-06-24 LAB — URINALYSIS, ROUTINE W REFLEX MICROSCOPIC
Glucose, UA: NEGATIVE
Ketones, ur: NEGATIVE
Protein, ur: NEGATIVE
Urobilinogen, UA: 0.2

## 2011-06-24 LAB — DIFFERENTIAL
Basophils Absolute: 0.1
Lymphocytes Relative: 30
Lymphs Abs: 2.4
Neutro Abs: 4.6
Neutrophils Relative %: 56

## 2011-06-24 LAB — PROTIME-INR
INR: 1
Prothrombin Time: 13.3

## 2011-06-25 LAB — CBC
HCT: 26.2 — ABNORMAL LOW
HCT: 29.4 — ABNORMAL LOW
Hemoglobin: 9.8 — ABNORMAL LOW
MCHC: 33.3
MCHC: 34.2
MCV: 100.1 — ABNORMAL HIGH
MCV: 98.4
MCV: 99.2
Platelets: 163
Platelets: 185
RBC: 2.82 — ABNORMAL LOW
RBC: 2.97 — ABNORMAL LOW
RDW: 13.7
RDW: 13.8
RDW: 13.9
WBC: 8.3

## 2011-06-25 LAB — BASIC METABOLIC PANEL
BUN: 16
BUN: 25 — ABNORMAL HIGH
BUN: 44 — ABNORMAL HIGH
CO2: 24
CO2: 26
CO2: 27
Calcium: 7.9 — ABNORMAL LOW
Calcium: 8 — ABNORMAL LOW
Calcium: 8.2 — ABNORMAL LOW
Chloride: 104
Chloride: 110
Chloride: 99
Creatinine, Ser: 1.85 — ABNORMAL HIGH
Creatinine, Ser: 1.88 — ABNORMAL HIGH
Creatinine, Ser: 2.15 — ABNORMAL HIGH
GFR calc Af Amer: 42 — ABNORMAL LOW
GFR calc Af Amer: 42 — ABNORMAL LOW
GFR calc Af Amer: 57 — ABNORMAL LOW
GFR calc non Af Amer: 29 — ABNORMAL LOW
GFR calc non Af Amer: 34 — ABNORMAL LOW
Glucose, Bld: 104 — ABNORMAL HIGH
Glucose, Bld: 120 — ABNORMAL HIGH
Glucose, Bld: 136 — ABNORMAL HIGH
Glucose, Bld: 140 — ABNORMAL HIGH
Potassium: 4.1
Potassium: 4.1
Sodium: 136
Sodium: 141

## 2011-06-25 LAB — PROTIME-INR
INR: 1.4
Prothrombin Time: 16.7 — ABNORMAL HIGH
Prothrombin Time: 17.7 — ABNORMAL HIGH
Prothrombin Time: 17.8 — ABNORMAL HIGH

## 2011-06-25 LAB — CROSSMATCH: ABO/RH(D): O NEG

## 2012-05-09 ENCOUNTER — Emergency Department (HOSPITAL_COMMUNITY): Payer: Medicare Other

## 2012-05-09 ENCOUNTER — Encounter (HOSPITAL_COMMUNITY): Payer: Self-pay | Admitting: *Deleted

## 2012-05-09 ENCOUNTER — Observation Stay (HOSPITAL_COMMUNITY): Payer: Medicare Other

## 2012-05-09 ENCOUNTER — Observation Stay (HOSPITAL_COMMUNITY)
Admission: EM | Admit: 2012-05-09 | Discharge: 2012-05-11 | Disposition: A | Discharge status: Home / Self Care | Payer: Medicare Other | Attending: Internal Medicine | Admitting: Internal Medicine

## 2012-05-09 DIAGNOSIS — Z9849 Cataract extraction status, unspecified eye: Secondary | ICD-10-CM

## 2012-05-09 DIAGNOSIS — E785 Hyperlipidemia, unspecified: Secondary | ICD-10-CM | POA: Diagnosis not present

## 2012-05-09 DIAGNOSIS — R000 Tachycardia, unspecified: Secondary | ICD-10-CM | POA: Diagnosis present

## 2012-05-09 DIAGNOSIS — I2699 Other pulmonary embolism without acute cor pulmonale: Secondary | ICD-10-CM | POA: Diagnosis present

## 2012-05-09 DIAGNOSIS — I129 Hypertensive chronic kidney disease with stage 1 through stage 4 chronic kidney disease, or unspecified chronic kidney disease: Secondary | ICD-10-CM | POA: Insufficient documentation

## 2012-05-09 DIAGNOSIS — I6350 Cerebral infarction due to unspecified occlusion or stenosis of unspecified cerebral artery: Principal | ICD-10-CM | POA: Insufficient documentation

## 2012-05-09 DIAGNOSIS — Z96649 Presence of unspecified artificial hip joint: Secondary | ICD-10-CM | POA: Insufficient documentation

## 2012-05-09 DIAGNOSIS — I639 Cerebral infarction, unspecified: Secondary | ICD-10-CM | POA: Diagnosis present

## 2012-05-09 DIAGNOSIS — I509 Heart failure, unspecified: Secondary | ICD-10-CM | POA: Insufficient documentation

## 2012-05-09 DIAGNOSIS — I10 Essential (primary) hypertension: Secondary | ICD-10-CM | POA: Diagnosis present

## 2012-05-09 DIAGNOSIS — J449 Chronic obstructive pulmonary disease, unspecified: Secondary | ICD-10-CM | POA: Diagnosis present

## 2012-05-09 DIAGNOSIS — E039 Hypothyroidism, unspecified: Secondary | ICD-10-CM | POA: Diagnosis present

## 2012-05-09 DIAGNOSIS — Z86711 Personal history of pulmonary embolism: Secondary | ICD-10-CM | POA: Insufficient documentation

## 2012-05-09 DIAGNOSIS — C61 Malignant neoplasm of prostate: Secondary | ICD-10-CM | POA: Diagnosis not present

## 2012-05-09 DIAGNOSIS — N189 Chronic kidney disease, unspecified: Secondary | ICD-10-CM | POA: Diagnosis present

## 2012-05-09 DIAGNOSIS — G459 Transient cerebral ischemic attack, unspecified: Secondary | ICD-10-CM

## 2012-05-09 DIAGNOSIS — I5042 Chronic combined systolic (congestive) and diastolic (congestive) heart failure: Secondary | ICD-10-CM | POA: Diagnosis present

## 2012-05-09 DIAGNOSIS — Z96643 Presence of artificial hip joint, bilateral: Secondary | ICD-10-CM

## 2012-05-09 DIAGNOSIS — I4891 Unspecified atrial fibrillation: Secondary | ICD-10-CM | POA: Diagnosis present

## 2012-05-09 DIAGNOSIS — J4489 Other specified chronic obstructive pulmonary disease: Secondary | ICD-10-CM | POA: Diagnosis present

## 2012-05-09 HISTORY — DX: Chronic obstructive pulmonary disease, unspecified: J44.9

## 2012-05-09 HISTORY — DX: Other pulmonary embolism without acute cor pulmonale: I26.99

## 2012-05-09 HISTORY — DX: Essential (primary) hypertension: I10

## 2012-05-09 LAB — COMPREHENSIVE METABOLIC PANEL
AST: 27 U/L (ref 0–37)
BUN: 32 mg/dL — ABNORMAL HIGH (ref 6–23)
CO2: 25 mEq/L (ref 19–32)
Calcium: 8.9 mg/dL (ref 8.4–10.5)
Chloride: 108 mEq/L (ref 96–112)
Creatinine, Ser: 1.59 mg/dL — ABNORMAL HIGH (ref 0.50–1.35)
GFR calc Af Amer: 43 mL/min — ABNORMAL LOW (ref >90)
GFR calc non Af Amer: 37 mL/min — ABNORMAL LOW (ref >90)
Total Bilirubin: 0.3 mg/dL (ref 0.3–1.2)

## 2012-05-09 LAB — HEMOGLOBIN A1C: Hgb A1c MFr Bld: 6.2 % — ABNORMAL HIGH (ref <5.7)

## 2012-05-09 LAB — CBC
Hemoglobin: 13.2 g/dL (ref 13.0–17.0)
MCH: 34.5 pg — ABNORMAL HIGH (ref 26.0–34.0)
MCHC: 34.1 g/dL (ref 30.0–36.0)
MCV: 101 fL — ABNORMAL HIGH (ref 78.0–100.0)
RBC: 3.83 MIL/uL — ABNORMAL LOW (ref 4.22–5.81)

## 2012-05-09 LAB — CBC WITH DIFFERENTIAL/PLATELET
Basophils Absolute: 0 10*3/uL (ref 0.0–0.1)
Eosinophils Relative: 1 % (ref 0–5)
HCT: 39.1 % (ref 39.0–52.0)
Hemoglobin: 13.1 g/dL (ref 13.0–17.0)
Lymphocytes Relative: 22 % (ref 12–46)
MCHC: 33.5 g/dL (ref 30.0–36.0)
MCV: 102.6 fL — ABNORMAL HIGH (ref 78.0–100.0)
Monocytes Absolute: 0.9 10*3/uL (ref 0.1–1.0)
Monocytes Relative: 10 % (ref 3–12)
RDW: 14 % (ref 11.5–15.5)
WBC: 9.6 10*3/uL (ref 4.0–10.5)

## 2012-05-09 LAB — PRO B NATRIURETIC PEPTIDE: Pro B Natriuretic peptide (BNP): 2379 pg/mL — ABNORMAL HIGH (ref 0–450)

## 2012-05-09 LAB — CARDIAC PANEL(CRET KIN+CKTOT+MB+TROPI)
CK, MB: 3.4 ng/mL (ref 0.3–4.0)
Relative Index: 2 (ref 0.0–2.5)
Total CK: 173 U/L (ref 7–232)
Troponin I: <0.3 ng/mL (ref <0.30)

## 2012-05-09 LAB — CREATININE, SERUM: Creatinine, Ser: 1.6 mg/dL — ABNORMAL HIGH (ref 0.50–1.35)

## 2012-05-09 MED ORDER — ACETAMINOPHEN 325 MG PO TABS: 650.0000 mg | ORAL_TABLET | ORAL | Status: DC | PRN

## 2012-05-09 MED ORDER — LORATADINE 10 MG PO TABS
10.0000 mg | ORAL_TABLET | Freq: Every day | ORAL | Status: DC
  Administered 2012-05-10 – 2012-05-11 (×2): 10 mg via ORAL
  Filled 2012-05-09 (×2): qty 1

## 2012-05-09 MED ORDER — OMEGA-3-ACID ETHYL ESTERS 1 G PO CAPS
1.0000 g | ORAL_CAPSULE | Freq: Every day | ORAL | Status: DC
  Administered 2012-05-10 – 2012-05-11 (×2): 1 g via ORAL
  Filled 2012-05-09 (×2): qty 1

## 2012-05-09 MED ORDER — DOCUSATE SODIUM 100 MG PO CAPS
100.0000 mg | ORAL_CAPSULE | Freq: Two times a day (BID) | ORAL | Status: DC
  Administered 2012-05-10: 100 mg via ORAL
  Filled 2012-05-09 (×2): qty 1

## 2012-05-09 MED ORDER — SALINE SPRAY 0.65 % NA SOLN
1.0000 | NASAL | Status: DC | PRN
  Filled 2012-05-09: qty 44

## 2012-05-09 MED ORDER — POLYETHYL GLYCOL-PROPYL GLYCOL 0.4-0.3 % OP SOLN
1.0000 [drp] | Freq: Every day | OPHTHALMIC | Status: DC | PRN
Start: 2012-05-09 — End: 2012-05-09

## 2012-05-09 MED ORDER — SODIUM CHLORIDE 0.9 % IV SOLN
INTRAVENOUS | Status: DC
  Administered 2012-05-09: 11:00:00 via INTRAVENOUS

## 2012-05-09 MED ORDER — POLYVINYL ALCOHOL 1.4 % OP SOLN
1.0000 [drp] | OPHTHALMIC | Status: DC | PRN
  Filled 2012-05-09: qty 15

## 2012-05-09 MED ORDER — HEPARIN SODIUM (PORCINE) 5000 UNIT/ML IJ SOLN
5000.0000 [IU] | Freq: Three times a day (TID) | INTRAMUSCULAR | Status: DC
  Administered 2012-05-09 – 2012-05-10 (×2): 5000 [IU] via SUBCUTANEOUS
  Filled 2012-05-09 (×5): qty 1

## 2012-05-09 MED ORDER — DOCUSATE SODIUM 100 MG PO CAPS: 100.0000 mg | ORAL_CAPSULE | Freq: Two times a day (BID) | ORAL | Status: DC

## 2012-05-09 MED ORDER — OMEGA-3 FATTY ACIDS 1000 MG PO CAPS: 1.0000 g | ORAL_CAPSULE | Freq: Every day | ORAL | Status: DC

## 2012-05-09 MED ORDER — ADULT MULTIVITAMIN W/MINERALS CH
1.0000 | ORAL_TABLET | Freq: Every evening | ORAL | Status: DC
  Administered 2012-05-09 – 2012-05-11 (×3): 1 via ORAL
  Filled 2012-05-09 (×3): qty 1

## 2012-05-09 MED ORDER — ALBUTEROL SULFATE (5 MG/ML) 0.5% IN NEBU: 2.5000 mg | INHALATION_SOLUTION | RESPIRATORY_TRACT | Status: DC | PRN

## 2012-05-09 MED ORDER — FEBUXOSTAT 40 MG PO TABS
80.0000 mg | ORAL_TABLET | Freq: Every evening | ORAL | Status: DC
Start: 2012-05-09 — End: 2012-05-11
  Administered 2012-05-09: 40 mg via ORAL
  Administered 2012-05-10: 80 mg via ORAL
  Administered 2012-05-11: 40 mg via ORAL
  Filled 2012-05-09 (×3): qty 2

## 2012-05-09 MED ORDER — LEVOTHYROXINE SODIUM 75 MCG PO TABS
75.0000 ug | ORAL_TABLET | Freq: Every day | ORAL | Status: DC
  Administered 2012-05-10 – 2012-05-11 (×2): 75 ug via ORAL
  Filled 2012-05-09 (×3): qty 1

## 2012-05-09 MED ORDER — SALINE NASAL SPRAY 0.65 % NA SOLN: 1.0000 | NASAL | Status: DC | PRN

## 2012-05-09 MED ORDER — METOPROLOL TARTRATE 1 MG/ML IV SOLN
5.0000 mg | Freq: Once | INTRAVENOUS | Status: DC
  Filled 2012-05-09: qty 5

## 2012-05-09 MED ORDER — ASPIRIN 325 MG PO TABS
325.0000 mg | ORAL_TABLET | Freq: Every day | ORAL | Status: DC
  Administered 2012-05-09 – 2012-05-10 (×2): 325 mg via ORAL
  Filled 2012-05-09 (×3): qty 1

## 2012-05-09 MED ORDER — BENEFIBER PO PACK: 1.0000 | PACK | Freq: Once | ORAL | Status: DC

## 2012-05-09 MED ORDER — ATORVASTATIN CALCIUM 40 MG PO TABS
40.0000 mg | ORAL_TABLET | Freq: Every day | ORAL | Status: DC
  Administered 2012-05-09 – 2012-05-10 (×2): 40 mg via ORAL
  Filled 2012-05-09 (×3): qty 1

## 2012-05-09 NOTE — Plan of Care (Signed)
Problem: Phase I Progression Outcomes Goal: Pt admitted to Stroke Unit Outcome: Completed/Met Date Met:  05/09/12 Patient admitted to [facility].  Oriented to room and unit protocols. Admission navigator completed.

## 2012-05-09 NOTE — ED Notes (Addendum)
Pt presents to department for evaluation of stroke like symptoms. Wife reports slurred speech, R sided facial droop and R sided arm drift @ 06:30 this morning. Upon arrival pt able to move all extremities and answer questions appropriately. Brace noted to R wrist from recent fracture, therefore pt has decreased grip strength in R hand. Speech clear at the time. Pt speaking complete sentences. Pt reports generalized weakness, but denies pain. He is conscious alert and oriented x4. Wears 3L O2 at home.

## 2012-05-09 NOTE — H&P (Signed)
Hospital Admission Note Date: 05/09/2012  Patient name: Manuel [name] Medical record number: [id number] Date of birth: [birth year] Age: 78 y.o. Gender: male PCP: No primary provider on file.  Medical Service: Internal Medicine Teaching Service--[facility]  Attending physician: Dr. Blanch Media    1st Contact: Dr. Virgina Organ   Pager: [phone] 2nd Contact: Dr. Coralyn Pear   Pager: [phone] After 5 pm or weekends: 1st Contact:      Pager: [phone] 2nd Contact:      Pager: [phone]  Chief Complaint: R arm weakness, R facial droop, and slurred speech  History of Present Illness:Manuel [name] is a 78 year old male with extensive PMH significant for HTN, CAD, Afib (not on anticoagulation), COPD on 3L O2, DVT, PE s/p IVC filter placement, renal insufficiency, prostate cancer, b/l hip arthroplasty, and s/p cholecystectomy, appendectomy, and partial colon resection with splenectomy presenting with right arm weakness, right sided facial droop, and slurred speech this morning.  Manuel [name] claims when he woke up this morning ~6am, his right arm was completely "limp" and he could not lift it at all.  He recently fractured his right wrist early May 2013 s/p fall and recently got his cast taken off 3 weeks ago and is now in a wrist brace which he occasionally takes off.  Due to right arm pain from fall, he has limited mobility of his right arm and admits to not using it much, but this was the first time he ever felt it limp and unmovable.  His wife and daughter were also in the room who relay that arm weakness was the first thing noticed and resolved in approximately 1 hour, then they later noticed facial droop while he was eating cereal and milk was running down his face, followed by slurred speech which all resolved by the time they got to the ED this morning around 9am.  Manuel [name] and his [name] deny any confusion, LOC, or seizure activity and report no prior similar episodes.  He denies any headaches,  lightheadedness, blurry vision, N/V/D, cough, chest pain, or abdominal pain at this time.  He does complain of worsening shortness of breath over the past few months despite being on 3L [place] [employer] at home, occasional chest tightness, and b/l leg edema.     Of note, Manuel [name] has been seeing his Cardiologist, Dr. Norman Herrlich in [place] and was told he has AFIB for the past year and a half.  He is not on current anticoagulation due to hx of DVT-->PE seven years ago, IVC filter placed, and then apparent massive GIB 5-6 years ago.  He was recently started on Verapamil by Dr. Dulce Sellar and is not on any aspirin at this time.  He claims to have had an echo and cath done but is not aware of the results, I have contacted Dr. Hulen Shouts office to get prior records.  His PMD is Dr. Tomasa Blase in [place], [place].  Additionally, Manuel [name] reports having a fall in June 2013 where he hit the back of his head and had to get stitches and CT head at [facility].  The daughter reports that CT head at that time was negative and then he also fell taking out the trash in May 2013 on his right arm leading to a fracture for which he now wears a brace.    I was able to speak to Dr. Orvan Falconer from [facility] in [place], [place] who was kind enough to confirm some prior medical records for me:  Last Echo 04/07/12: LVEF 40-45% Cardiology note by Dr. Dulce Sellar on 04/10/12: Patient has Afib, decompensated heart failture, and [name] renal insufficiency, will work with him to optimize treatment and start Ranexa.  Due to large GIB in the past, was not anticoagulating and wife and husband reported intolerance to ASA and Warfarin.   Labs from 03/31/12: K+ 3.7, BUN/Cr: 51/1.7, GFR:38, BNP: 3780, Hb/Hct:  13.3/40, WBC:8.9, PLT:194, MCV:104  Meds: Current Outpatient Rx  Name Route Sig Dispense Refill  . ALBUTEROL SULFATE (2.5 MG/3ML) 0.083% IN NEBU Nebulization Take 2.5 mg by nebulization 3 (three) times daily.    Marland Kitchen  CARVEDILOL 3.125 MG PO TABS Oral Take 3.125 mg by mouth 2 (two) times daily with a meal.    . CYANOCOBALAMIN 1000 MCG/ML IJ SOLN Intramuscular Inject 1,000 mcg into the muscle every 30 (thirty) days.    . FEBUXOSTAT 40 MG PO TABS Oral Take 80 mg by mouth every evening.    Marland Kitchen OMEGA-3 FATTY ACIDS 1000 MG PO CAPS Oral Take 1 g by mouth daily.    . FUROSEMIDE 80 MG PO TABS Oral Take 80 mg by mouth 2 (two) times daily.    Marland Kitchen LANSOPRAZOLE 30 MG PO CPDR Oral Take 30 mg by mouth every evening.    Marland Kitchen LEVOTHYROXINE SODIUM 75 MCG PO TABS Oral Take 75 mcg by mouth daily.    Marland Kitchen LORATADINE 10 MG PO TABS Oral Take 10 mg by mouth daily.    . ADULT MULTIVITAMIN W/MINERALS CH Oral Take 1 tablet by mouth every evening.    Marland Kitchen POLYETHYL GLYCOL-PROPYL GLYCOL 0.4-0.3 % OP SOLN Ophthalmic Apply 1 drop to eye daily as needed. For dry eyes    . RANOLAZINE ER 500 MG PO TB12 Oral Take 500 mg by mouth 2 (two) times daily.    Marland Kitchen SIMVASTATIN 80 MG PO TABS Oral Take 80 mg by mouth every evening.    Marland Kitchen SALINE NASAL SPRAY 0.65 % NA SOLN Nasal Place 1 spray into the nose as needed. For congestion    . VERAPAMIL HCL 80 MG PO TABS Oral Take 80 mg by mouth 2 (two) times daily.    . BENEFIBER PO Oral Take 1 tablet by mouth daily.     Allergies: Allergies as of 05/09/2012 - Review Complete 05/09/2012  Allergen Reaction Noted  . Morphine and related Nausea And Vomiting 05/09/2012   Past Medical History  Diagnosis Date  . Pulmonary embolism   . COPD ([name] obstructive pulmonary disease)   . Hypertension    History reviewed. No pertinent past surgical history. History reviewed. No pertinent [name] history. History   Social History  . Marital Status: Married    Spouse Name: N/A    Number of Children: N/A  . Years of Education: N/A   Occupational History  . Not on file.   Social History Main Topics  . Smoking status: Not on file  . Smokeless tobacco: Not on file  . Alcohol Use: No  . Drug Use: No  . Sexually Active:     Other Topics Concern  . Not on file   Social History Narrative  . No narrative on file   Review of Systems: Pertinent items are noted in HPI. Constitutional: Denies fever, chills, diaphoresis, appetite change and fatigue.  HEENT: + hearing loss. Denies photophobia, eye pain, redness, hearing loss, ear pain, congestion, sore throat, rhinorrhea, sneezing, mouth sores, trouble swallowing, neck pain, neck stiffness and tinnitus.   Respiratory: + sob + chest tightness. Denies cough, and  wheezing.   Cardiovascular: + leg swelling Denies chest pain, palpitations. Gastrointestinal: hx blood in stool. Denies nausea, vomiting, abdominal pain, diarrhea, constipation,and abdominal distention.  Genitourinary: Denies dysuria, urgency, frequency, hematuria, flank pain and difficulty urinating.  Musculoskeletal: hx DJD, b/l hip replacements, unsteady gait. Denies myalgias, back pain, joint swelling.  Skin: Denies pallor, rash and wound.  Neurological: Denies dizziness, seizures, syncope, weakness, light-headedness,and headaches.  Hematological: + easy bruising, + prostate cancer hx s/p prostate surgery. Denies adenopathy and [name] bleeding history  Psychiatric/Behavioral: Denies suicidal ideation, mood changes, confusion, nervousness, sleep disturbance and agitation  Physical Exam: Blood pressure 135/65, pulse 95, temperature 98.7 F (37.1 C), temperature source Oral, resp. rate 17, SpO2 97.00%. Vitals reviewed. General: resting in bed, NAD HEENT: PERRLA, EOMI, no scleral icterus Cardiac:Irregularly irregular, + 1-2/6 systolic murmur, no rubs or gallops. +JVD Pulm: clear to auscultation bilaterally, no wheezes, rales, or rhonchi Abd: soft, nontender, nondistended, +BS present Ext: + b/l compression stockings on lower extremities, + right wrist brace in place, +2dp b/l, warm and well perfused, no pedal edema Neuro: alert and oriented X3, cranial nerves II-XII grossly intact, sensation to light  touch equal in bilateral upper and lower extremities. Strength weaker in RUE than LUE.   Lab results: Basic Metabolic Panel:  Basename 05/09/12 1102  NA 143  K 3.9  CL 108  CO2 25  GLUCOSE 98  BUN 32*  CREATININE 1.59*  CALCIUM 8.9  MG --  PHOS --   Liver Function Tests:  Basename 05/09/12 1102  AST 27  ALT 29  ALKPHOS 63  BILITOT 0.3  PROT 6.1  ALBUMIN 3.1*   CBC:  Basename 05/09/12 1102  WBC 9.6  NEUTROABS 6.4  HGB 13.1  HCT 39.1  MCV 102.6*  PLT 178   BNP:  Basename 05/09/12 1106  PROBNP 2379.0*   Imaging results:  Dg Chest 2 View  05/09/2012  *RADIOLOGY REPORT*  Clinical Data: Pain.  Speaking unclearly this morning.  CHEST - 2 VIEW  Comparison: 03/31/2012  Findings: The heart is enlarged.  There are no focal consolidations or pleural effusions.  No edema.  The patient has an inferior vena cava filter.  Surgical clips are identified in the right upper quadrant of the abdomen.  IMPRESSION:  1.  Cardiomegaly. 2.No evidence for acute pulmonary abnormality.  Original Report Authenticated By: Patterson Hammersmith, M.D.   Ct Head Wo Contrast  05/09/2012  *RADIOLOGY REPORT*  Clinical Data: Right hand weakness.  Right-sided facial droop. Headache.  History of hypertension and prostate cancer.  CT HEAD WITHOUT CONTRAST  Technique:  Contiguous axial images were obtained from the base of the skull through the vertex without contrast.  Comparison: 04/05/2012  Findings: Bone windows demonstrate left maxillary sinus mucous retention cyst or polyp. Clear mastoid air cells.  Soft tissue windows demonstrate no  mass lesion, hemorrhage, hydrocephalus, acute infarct, intra-axial, or extra-axial fluid collection.  IMPRESSION: No acute intracranial abnormality.  Original Report Authenticated By: Consuello Bossier, M.D.   Other results: EKG: 102bpm atrial fibrillation, RBBB  Assessment & Plan by Problem: Manuel [name] is a 78 year old pleasant man presenting with extensive PMH  significant for CAD, heart failure EF:40-45% (04/07/12), Afib not on anticoagulation, DVT/PE with IVC filter, and [name] renal insufficiency presenting to the ED with right sided arm weakness, slurred speech, and right sided facial droop this morning.  Symptoms as per [name] started upon awakening this morning, started with right arm weakness which resolved in less than an hour  followed by slurred speech and facial droop which also resolved by the time of admission.  CT head negative for any acute intracranial abnormality.   Manuel [name] current presentation appears to be secondary to possible TIA vs. Ischemic stroke in the setting of AFIB without anticoagulation with worsening CHF.  In regards to shortness of breath PE is included in the differential due to previous hx of DVT and PE, tachycardia on admission, age, limited mobility of RUE s/p fracture with geneva score of 9-11 showing intermediate probability (28%).  However, PE could be less likely due to existing IVC filter, worsening shortness of breath for several months and not acute and also appears more attributed to CHF with no outright chest pain complaints but does claim chest tightness.  Additionally, Manuel [name] has [name] renal insufficiency which would be difficult to administer contrast dye for CT angio chest.  COPD exacerbation may also account for SOB as Manuel [name] is on 3L o2 at home, however, lung sounds are clear b/l on exam, and cxr shows no evidence of acute pulmonary abnormality.  Worsening CHF is most likely the contributing factor for SOB with EF of 40-45%, cardiomegaly, complaints of recent leg edema, and elevated BNP.    1) TIA (transient ischemic attack) vs. Ischemic stroke--transient right arm weakness, right sided facial droop, and slurred speech this morning in the setting of AFIB without anticoagulation. ABCD2 score of 6 = 8.1% stroke risk.  CT head negative for hemorrhage or acute intracranial abnormality -admit to  telemetry inpatient -f/u MRI brain and MRA head/brain without contrast -f/u 2D echo, previous echo 04/07/12 with LVEF 40-45% -f/u carotid dopplers -f/u EKG in AM -consider ASA in this setting despite no anticoagulation in the past as per patient.  Clarified further why he does not take ASA and he said because of constipation not for fear of bleeding, agreed for ASA. -Heparin vs SCDs for DVT prophylaxis--given hx of DVT/PE, heparin may be more appropriate at this time -neuro checks with vital signs -pulse oximetry -heart healthy diet since passed speech and swallow in ED -I/O -CBG -risk stratification: f/u HbA1c, fasting lipid panel, UDS -Lipitor 40mg  QD -Tylenol PRN -f/u cardiac enzymes  2)  HTN (hypertension)--135/65 on admission. On Coreg 3.125mg  BID and Verapamil 80mg  BID at home -hold HTN meds for now in setting of TIA -resume home meds if MRI negative -monitor BP  3)  Dyspnea--could be attributed to worsening CHF vs. PE (geneva score of 9-11) but present IVC filter, vs. COPD exacerbation -continue O2 via [place] 3L--same as home dose -monitor vitals and o2 sat -discussed with Manuel [name] renal insufficiency; [name] would like to hold off on getting CT chest with dye at this time given the risks but will continue to discuss. Would like to proceed with MRI first -albuterol nebz PRN  4) [name] renal insufficiency--BUN/Cr=32/1.6 on admission, previous renal function from 03/31/12: BUN/Cr=51/1.7 -F/U BMP -Hold IVF fluids at this time given setting of CHF  5) CHF--previous BNP of 3780 (03/31/12) with EF of 40-45% (04/07/12), worsening sob and b/l leg edema -f/u cardiac enzymes -f/u EKG  -f/u echo, last EF 40-45% 04/07/12 -f/u records from his Cardiologist Dr. Annamaria Boots left with office staff -hold Lasix for now given setting of TIA, if MRI  negative, restart home dose: 80mg  PO BID -ProBNP--2379 today  6) Hx of prostate cancer--s/p  surgery; 2003, [name] claims oncologist may have recently found new malignant prostate cells but unsure of exact specifics, will f/u as outpatient -stable -follows up with oncologist in [place], [place]  7) Hx of b/l hip arthroplasty 2009 (L) and 2010 (R) -stable -walk with assistance if gait unstable -PT/OT  8) Hypothyroidism--on synthroid -stable -continue synthroid home dose QD  9) Hyperlipidemia--on simvastatin 80mg  QD at home -f/u lipid panel -Continue Lipitor  10) Hx of b/l cataract surgery and recent b/l eyelid lift -vision stable -follow up with ophthalmologist as outpatient  11) Hx of Pulmonary Embolism--IVC filter in place, not on current anticoagulation, was on Coumadin in the past but claims had reaction with sulfa drugs leading to major GIB, and stopped anticoagulation at this time. Wears compression stockkings -see #3 -Geneva score 9-11 at this time, intermediate probability -consider CT angio chest but with caution considering [name] renal insufficiency and risks of contrast dye -discussed with Mr. Yuhas and [name] in regards to possibility of getting CT angio chest for r/o PE but risk of contrast dye worsening renal function in setting of [name] renal insufficiency; [name] would like to hold off on getting CT chest with dye at this time given the risks but will continue to discuss  Diet: Heart healthy DVT prophylaxis: Heparin Dispo: pending clinical improvement, laboratory results, TIA and cardiac workup  Signed: Darden Palmer 05/09/2012, 2:51 PM

## 2012-05-09 NOTE — ED Notes (Signed)
Reports waking up this am with weakness to right arm and facial droop per family. At this time, grips are equal and pt reports arm feels back to normal. No arm drift noted, grips equal and speech is clear.

## 2012-05-09 NOTE — ED Provider Notes (Signed)
History     CSN: [id number]  Arrival date & time 05/09/12  0907   First MD Initiated Contact with Patient 05/09/12 [phone]      Chief Complaint  Patient presents with  . Weakness    (Consider location/radiation/quality/duration/timing/severity/associated sxs/prior treatment) Patient is a 78 y.o. male presenting with weakness. The history is provided by the patient.  Weakness  Additional symptoms include weakness.   patient here with weakness to his right arm upon awakening this morning which has since resolved. Family also notes slurred speech and left-sided facial droop. Denies any headache or neck pain. Right upper extremity weakness has resolved. Gait has been per his baseline. Denies any chest or abdominal pain. No syncope or near-syncope.  Past Medical History  Diagnosis Date  . Pulmonary embolism   . COPD (chronic obstructive pulmonary disease)   . Hypertension     History reviewed. No pertinent past surgical history.  History reviewed. No pertinent family history.  History  Substance Use Topics  . Smoking status: Not on file  . Smokeless tobacco: Not on file  . Alcohol Use: No      Review of Systems  Neurological: Positive for weakness.  All other systems reviewed and are negative.    Allergies  Morphine and related  Home Medications   Current Outpatient Rx  Name Route Sig Dispense Refill  . ALBUTEROL SULFATE (2.5 MG/3ML) 0.083% IN NEBU Nebulization Take 2.5 mg by nebulization 3 (three) times daily.    Marland Kitchen CARVEDILOL 3.125 MG PO TABS Oral Take 3.125 mg by mouth 2 (two) times daily with a meal.    . CYANOCOBALAMIN 1000 MCG/ML IJ SOLN Intramuscular Inject 1,000 mcg into the muscle every 30 (thirty) days.    . FEBUXOSTAT 40 MG PO TABS Oral Take 80 mg by mouth every evening.    Marland Kitchen OMEGA-3 FATTY ACIDS 1000 MG PO CAPS Oral Take 1 g by mouth daily.    . FUROSEMIDE 80 MG PO TABS Oral Take 80 mg by mouth 2 (two) times daily.    Marland Kitchen LANSOPRAZOLE 30 MG PO CPDR Oral  Take 30 mg by mouth every evening.    Marland Kitchen LEVOTHYROXINE SODIUM 75 MCG PO TABS Oral Take 75 mcg by mouth daily.    Marland Kitchen LORATADINE 10 MG PO TABS Oral Take 10 mg by mouth daily.    . ADULT MULTIVITAMIN W/MINERALS CH Oral Take 1 tablet by mouth every evening.    Marland Kitchen POLYETHYL GLYCOL-PROPYL GLYCOL 0.4-0.3 % OP SOLN Ophthalmic Apply 1 drop to eye daily as needed. For dry eyes    . RANOLAZINE ER 500 MG PO TB12 Oral Take 500 mg by mouth 2 (two) times daily.    Marland Kitchen SIMVASTATIN 80 MG PO TABS Oral Take 80 mg by mouth every evening.    Marland Kitchen SALINE NASAL SPRAY 0.65 % NA SOLN Nasal Place 1 spray into the nose as needed. For congestion    . VERAPAMIL HCL 80 MG PO TABS Oral Take 80 mg by mouth 2 (two) times daily.    . BENEFIBER PO Oral Take 1 tablet by mouth daily.      BP 130/60  Pulse 97  Temp 98.1 F (36.7 C) (Oral)  Resp 14  SpO2 96%  Physical Exam  Nursing note and vitals reviewed. Constitutional: He is oriented to person, place, and time. He appears well-developed and well-nourished.  Non-toxic appearance. No distress.  HENT:  Head: Normocephalic and atraumatic.  Eyes: Conjunctivae, EOM and lids are normal. Pupils are equal, round,  and reactive to light.  Neck: Normal range of motion. Neck supple. No tracheal deviation present. No mass present.  Cardiovascular: Normal rate, regular rhythm and normal heart sounds.  Exam reveals no gallop.   No murmur heard. Pulmonary/Chest: Effort normal. No stridor. No respiratory distress. He has decreased breath sounds. He has wheezes. He has no rhonchi. He has no rales.  Abdominal: Soft. Normal appearance and bowel sounds are normal. He exhibits no distension. There is no tenderness. There is no rebound and no CVA tenderness.  Musculoskeletal: Normal range of motion. He exhibits no edema and no tenderness.  Neurological: He is alert and oriented to person, place, and time. He has normal strength. No cranial nerve deficit or sensory deficit. GCS eye subscore is 4. GCS  verbal subscore is 5. GCS motor subscore is 6.       Questionable slurred speech   Skin: Skin is warm and dry. No abrasion and no rash noted.  Psychiatric: He has a normal mood and affect. His speech is normal and behavior is normal.    ED Course  Procedures (including critical care time)   Labs Reviewed  CBC WITH DIFFERENTIAL  COMPREHENSIVE METABOLIC PANEL  PRO B NATRIURETIC PEPTIDE   No results found.   No diagnosis found.    MDM   Date: 05/09/2012  Rate: 102  Rhythm: atrial fibrillation  QRS Axis: normal  Intervals: normal  ST/T Wave abnormalities: nonspecific ST changes  Conduction Disutrbances:right bundle branch block  Narrative Interpretation:   Old EKG Reviewed: unchanged  Patient to be admitted for evaluation of TIA likely due 2 the A. fib for which patient is not taking any anti-coagulation          Toy Baker, MD 05/09/12 1313

## 2012-05-09 NOTE — ED Notes (Signed)
Pt transported to x-ray and CT scan. 

## 2012-05-09 NOTE — Progress Notes (Signed)
Patient admitted to floor with family present at bedside.  Reviewed and instructed on unit policy and safety.  Completed assessment navigators.  Patient is slightly hard of hearing but able to hear most questions spoken in moderate tone of voice. Encouraged patient to call for assistance if needing to get out of bed for the bathroom.

## 2012-05-09 NOTE — Progress Notes (Addendum)
Pt got up to BR, and heart rate went up to 170's. B/P 121/75. Pt is asymptomatic, has no complaints. Dr Ellyn Hack notified, no new orders received. Will continue to monitor.

## 2012-05-10 DIAGNOSIS — I509 Heart failure, unspecified: Secondary | ICD-10-CM

## 2012-05-10 DIAGNOSIS — R000 Tachycardia, unspecified: Secondary | ICD-10-CM

## 2012-05-10 DIAGNOSIS — N189 Chronic kidney disease, unspecified: Secondary | ICD-10-CM

## 2012-05-10 DIAGNOSIS — I639 Cerebral infarction, unspecified: Secondary | ICD-10-CM | POA: Diagnosis present

## 2012-05-10 DIAGNOSIS — I129 Hypertensive chronic kidney disease with stage 1 through stage 4 chronic kidney disease, or unspecified chronic kidney disease: Secondary | ICD-10-CM

## 2012-05-10 DIAGNOSIS — I6350 Cerebral infarction due to unspecified occlusion or stenosis of unspecified cerebral artery: Secondary | ICD-10-CM

## 2012-05-10 LAB — BASIC METABOLIC PANEL
Calcium: 9.2 mg/dL (ref 8.4–10.5)
Creatinine, Ser: 1.5 mg/dL — ABNORMAL HIGH (ref 0.50–1.35)
GFR calc Af Amer: 46 mL/min — ABNORMAL LOW (ref >90)
GFR calc non Af Amer: 40 mL/min — ABNORMAL LOW (ref >90)
Glucose, Bld: 94 mg/dL (ref 70–99)
Potassium: 3.7 mEq/L (ref 3.5–5.1)
Sodium: 144 mEq/L (ref 135–145)

## 2012-05-10 LAB — RAPID URINE DRUG SCREEN, HOSP PERFORMED
Amphetamines: NOT DETECTED
Benzodiazepines: NOT DETECTED
Cocaine: NOT DETECTED
Opiates: NOT DETECTED
Tetrahydrocannabinol: NOT DETECTED

## 2012-05-10 LAB — CBC
Hemoglobin: 12.8 g/dL — ABNORMAL LOW (ref 13.0–17.0)
MCH: 34.6 pg — ABNORMAL HIGH (ref 26.0–34.0)
MCHC: 34 g/dL (ref 30.0–36.0)
RDW: 14.1 % (ref 11.5–15.5)

## 2012-05-10 LAB — PROTIME-INR: Prothrombin Time: 12.8 seconds (ref 11.6–15.2)

## 2012-05-10 LAB — LIPID PANEL
HDL: 45 mg/dL (ref >39)
LDL Cholesterol: 53 mg/dL (ref 0–99)
Triglycerides: 100 mg/dL (ref <150)
VLDL: 20 mg/dL (ref 0–40)

## 2012-05-10 MED ORDER — COUMADIN BOOK
Freq: Once | Status: AC
  Administered 2012-05-10: 17:00:00
  Filled 2012-05-10: qty 1

## 2012-05-10 MED ORDER — DIGOXIN 125 MCG PO TABS
0.1250 mg | ORAL_TABLET | Freq: Every day | ORAL | Status: DC
  Administered 2012-05-10: 0.125 mg via ORAL
  Filled 2012-05-10 (×2): qty 1

## 2012-05-10 MED ORDER — WARFARIN VIDEO
Freq: Once | Status: AC
  Administered 2012-05-10: 17:00:00

## 2012-05-10 MED ORDER — WARFARIN SODIUM 6 MG PO TABS
6.0000 mg | ORAL_TABLET | Freq: Once | ORAL | Status: AC
  Administered 2012-05-10: 6 mg via ORAL
  Filled 2012-05-10: qty 1

## 2012-05-10 MED ORDER — WARFARIN - PHARMACIST DOSING INPATIENT
Freq: Every day | Status: DC
  Administered 2012-05-10: 18:00:00

## 2012-05-10 NOTE — Progress Notes (Signed)
ANTICOAGULATION CONSULT NOTE - Initial Consult  Pharmacy Consult for Coumadin Indication: atrial fibrillation, acute embolic CVA this admission  Allergies  Allergen Reactions  . Morphine And Related Nausea And Vomiting    Patient Measurements: Height: 5\' 7"  (170.2 cm) Weight: 179 lb 10.8 oz (81.5 kg) IBW/kg (Calculated) : 66.1   Vital Signs: Temp: 97.3 F (36.3 C) (08/18 0710) Temp src: Axillary (08/18 0941) BP: 143/72 mmHg (08/18 0941) Pulse Rate: 111  (08/18 0941)  Labs:  Basename 05/10/12 1143 05/10/12 0626 05/09/12 1750 05/09/12 1559 05/09/12 1102  HGB -- 12.8* -- 13.2 --  HCT -- 37.7* -- 38.7* 39.1  PLT -- 164 -- 205 178  APTT -- -- -- -- --  LABPROT 12.8 -- -- -- --  INR 0.94 -- -- -- --  HEPARINUNFRC -- -- -- -- --  CREATININE -- 1.50* -- 1.60* 1.59*  CKTOTAL -- -- 173 -- --  CKMB -- -- 3.4 -- --  TROPONINI -- -- <0.30 -- --    Estimated Creatinine Clearance: 34.8 ml/min (by C-G formula based on Cr of 1.5).   Medical History: Past Medical History  Diagnosis Date  . Pulmonary embolism   . COPD (chronic obstructive pulmonary disease)   . Hypertension     Medications:  Prescriptions prior to admission  Medication Sig Dispense Refill  . albuterol (PROVENTIL) (2.5 MG/3ML) 0.083% nebulizer solution Take 2.5 mg by nebulization 3 (three) times daily.      . carvedilol (COREG) 3.125 MG tablet Take 3.125 mg by mouth 2 (two) times daily with a meal.      . cyanocobalamin (,VITAMIN B-12,) 1000 MCG/ML injection Inject 1,000 mcg into the muscle every 30 (thirty) days.      . febuxostat (ULORIC) 40 MG tablet Take 80 mg by mouth every evening.      . fish oil-omega-3 fatty acids 1000 MG capsule Take 1 g by mouth daily.      . furosemide (LASIX) 80 MG tablet Take 80 mg by mouth 2 (two) times daily.      . lansoprazole (PREVACID) 30 MG capsule Take 30 mg by mouth every evening.      Marland Kitchen levothyroxine (SYNTHROID, LEVOTHROID) 75 MCG tablet Take 75 mcg by mouth daily.        Marland Kitchen loratadine (CLARITIN) 10 MG tablet Take 10 mg by mouth daily.      . Multiple Vitamin (MULTIVITAMIN WITH MINERALS) TABS Take 1 tablet by mouth every evening.      Bertram Gala Glycol-Propyl Glycol (SYSTANE) 0.4-0.3 % SOLN Apply 1 drop to eye daily as needed. For dry eyes      . ranolazine (RANEXA) 500 MG 12 hr tablet Take 500 mg by mouth 2 (two) times daily.      . simvastatin (ZOCOR) 80 MG tablet Take 80 mg by mouth every evening.      . sodium chloride (OCEAN) 0.65 % nasal spray Place 1 spray into the nose as needed. For congestion      . verapamil (CALAN) 80 MG tablet Take 80 mg by mouth 2 (two) times daily.      . Wheat Dextrin (BENEFIBER PO) Take 1 tablet by mouth daily.        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 Patient was on Coumadin ~7 years ago for a PE and had a massive GI bleed thought to be due to combination of Coumadin and  sulfa antibiotic. Since then he developed Afib but was not put on anticoagulation due to previous GI bleed. Pharmacy consulted to begin Coumadin. INR is normal at baseline. No bleeding noted.  Spoke with patient and family. He does not remember what Coumadin dose he took previously so will start cautiously. I also completed his education.  Goal of Therapy:  INR 2-3   Plan:  -Coumadin 6 mg po tonight -INR daily -Coumadin book/video  [facility], [address].D., BCPS Clinical Pharmacist Pager: [phone] 05/10/2012 1:33 PM

## 2012-05-10 NOTE — Progress Notes (Signed)
VASCULAR LAB PRELIMINARY  PRELIMINARY  PRELIMINARY  PRELIMINARY  Carotid Dopplers completed.    Preliminary report:  No significant ICA stenosis.  Vertebral artery flow is antegrade.  Manuel [name], 05/10/2012, 6:14 PM

## 2012-05-10 NOTE — H&P (Signed)
Internal Medicine Teaching Service Attending Note Date: 05/10/2012  Patient name: Manuel [name]  Medical record number: [id number]  Date of birth: 21-Nov-1921   I have seen and evaluated [name] and discussed their care with the Residency Team. Please see Dr Macario Golds H&P for full details. Mr Smeltz is an 4 man who is an amazing historian - he is extremely;y well educated about his medical issues and has a Architect.    He was admitted for R arm weakness, R facial droop, and slurred speech. He has a h/o warfarin use 2/2 PE and was well controlled on warfarin without any mgmt issues until he was put on a sulf drug for a mild cellulitis. He had a massive GI bleed about 4 yrs ago that required 11 unit transfusion. He had a colonoscopy and had a couple of bleeding sites addressed. He was D/C'd on no anticoagulation and remains on no anticoagulation. Pt is adament that he did well on the warfarin and has no issues resuming it bc he is convinced that it was the sulfa drug that caused him to bleed.   He has had A Fib for about two years but is not on any anticoagulation. It was discovered when he went for eyelid reduction surgery bc his eyelashes were scraping his eyes. HE is on no anticoag 2/2 h/o GI bleed.  He has had two falls. The first was in May when he was at the recylcing center and the fence had been taken down and was left lying on the ground and he got his foot tangled in it and fell, fracturing his R lower arm. The other was more recent when he was trying to stand up on a chair to get to a high cabinet and fell, resulting in two staples to the back of his head. He has no trouble ambulating around his home. He goes out to eat with his wife and does the grocery shopping and has had no falls with these activities.   He has prostate cancer and as the pt explains it there are some cancer cells somewhere so the doctor is giving him shots every 6 months (that cause nocturnal hot  flashed) to prevent their growth.  He has O2 dependent lung disease that started after his PE. He required no O2 prior to the PE. He uses O2 almost 24 hours a day - may not take it on a short trip into town but does take it into the grocery store and when they eat dinner out. He gets DOE when walking back to car after dinner in a restaurant. He may have a component of COPD (no PFT's known) bc on nebs and MDI's at home but they leave him more dyspneic so he generally doesn't use them.   If he is unable to make decisions for himself, he wants his wife and daughter to make his decisions. If he were to have a cardiopul event, and the MD's felt that there might be a reversible cause, he would want all interventions. If the MD's decide that he is not going to have a decent chance at survival, then he "doesn't want to be hooked up to machines". So full code for now.   PMHx, meds, allergies, soc hx, fam hx were reviewed  Filed Vitals:   05/09/12 2217 05/10/12 0054 05/10/12 0710 05/10/12 0941  BP: 121/75 133/54 128/81 143/72  Pulse: 75 94 102 111  Temp: 97.4 F (36.3 C) 97.3 F (36.3 C) 97.3 F (  36.3 C)   TempSrc: Oral Oral Oral Axillary  Resp: 20 20 20 20   Height:      Weight:      SpO2: 97% 92% 99% 100%   GEN : very pleasant man, only slightly hard of hearing, lying in bed. No acute distress Hirr irr, distant heart sounds, could not hear murmur LCTAB good air movement ABD + BS, S/NT/ND EXT +1 edema B, skin darkening c/w CVI Neuro : symmetric face, nl speech, moves all four extremities freely and against gravity Skin : thin, multiple ecchymosis.  Labs and imaging reviewed  Assessment and Plan: I agree with the formulated Assessment and Plan with the following changes:   1. CVA - two small ischemic strokes in L MCA territory. Concern that these are embolic 2/2 A fib without anticoagulation. Pt is clear that he has no concerns restarting the warfarin so will start tonight after he speaks to  his wife. We will ask cards if they would perform TEE - due to his h/o GI bleed and since the patient is so functional and independent, would like to know if there is a L atrial thrombus that would be a source for additional thrombus / CVA in the future. This would be important in case his HgB trends downwards or he has a fall. A known thrombus would push his doctors to stop warfarin only if a life threatening bleed occurs. PT/OT/ASA 81 mg. Hold BP meds.  2. Falls - PT/OT will eval pt but the two falls all seem mechanical and not a falling tendency. Pt appears to be stable as long as he doesn't try to climb onto a chair or walk over a fence.  3. A Fib with RVR - This is challenging as we do not want to use a BB or CCB due to his low BP and new CVA. Most of the tachycardia is exertional related and resolves with rest. Will give a trial of Dig (goal level 0.5 - 0.8) since pt has also has LV dysfxn. Since most of his tachycardia is exertional, Dig likely will not be effective but is best option at this time. BB only if necessary - sustained tachy. Resume BB and CCB 2-3 days.  4. Systolic dysfxn - pt on BB, lasix, and statin at home. Do not know why no ACEI but may be 2/2 CRI. All meds held 2/2 acute CVA. Euvolemic.  5. Prostate cancer - primary MD may need to consider bisphosphonates as pt on Lupron and has had a recent fracture.   Burns Spain, MD 8/18/20139:50 AM

## 2012-05-10 NOTE — Progress Notes (Signed)
Subjective: Mr. Rogan was seen and examined at bedside today with his family in the room as well.  He claims to be doing well with no major complaints.  He slept well through the night, had one BM, and tolerated dinner and breakfast this morning.  He still complains of shortness of breath worse upon movement but claims this is his baseline lately and continues to be on [place] 3LO2.  He denies any fever, chills, N/V/D, headaches, lightheadedness, blurry vision, chest pain, abdominal pain, cough, dysuria at this time.  The family discussed anticoagulation further and have agreed to start Coumadin therapy at this time for his CVA likely embolic in nature secondary to Afib.  I am still awaiting past medical records to be sent from his PMD and cardiology office but records obtained by the on call physician from yesterday have been noted.    Objective: Vital signs in last 24 hours: Filed Vitals:   05/09/12 2217 05/10/12 0054 05/10/12 0710 05/10/12 0941  BP: 121/75 133/54 128/81 143/72  Pulse: 75 94 102 111  Temp: 97.4 F (36.3 C) 97.3 F (36.3 C) 97.3 F (36.3 C)   TempSrc: Oral Oral Oral Axillary  Resp: 20 20 20 20   Height:      Weight:      SpO2: 97% 92% 99% 100%   Weight change:   Intake/Output Summary (Last 24 hours) at 05/10/12 1155 Last data filed at 05/10/12 1019  Gross per 24 hour  Intake    240 ml  Output      0 ml  Net    240 ml   Physical Exam:  Vitals reviewed.  General: resting in bed, NAD  HEENT: PERRLA, EOMI, no scleral icterus  Cardiac:Irregularly irregular, tachycardia, + 1-2/6 systolic murmur, no rubs or gallops. +JVD  Pulm: clear to auscultation bilaterally, no wheezes, rales, or rhonchi  Abd: soft, nontender, nondistended, +BS present  Ext: + 2 pitting edema b/l lower extremities, + right wrist brace in place, +2dp b/l, warm and well perfused, no pedal edema, + multiple ecchymosis on b/l upper extremities.  Neuro: alert and oriented X3, cranial nerves II-XII grossly  intact, sensation to light touch equal in bilateral upper and lower extremities. Strength weaker in RUE than LUE, limited RUE mobility due to recent fracture.   Lab Results: Basic Metabolic Panel:  Lab 05/10/12 9147 05/09/12 1559 05/09/12 1102  NA 144 -- 143  K 3.7 -- 3.9  CL 108 -- 108  CO2 28 -- 25  GLUCOSE 94 -- 98  BUN 29* -- 32*  CREATININE 1.50* 1.60* --  CALCIUM 9.2 -- 8.9  MG -- -- --  PHOS -- -- --   Liver Function Tests:  Lab 05/09/12 1102  AST 27  ALT 29  ALKPHOS 63  BILITOT 0.3  PROT 6.1  ALBUMIN 3.1*   CBC:  Lab 05/10/12 0626 05/09/12 1559 05/09/12 1102  WBC 8.3 9.6 --  NEUTROABS -- -- 6.4  HGB 12.8* 13.2 --  HCT 37.7* 38.7* --  MCV 101.9* 101.0* --  PLT 164 205 --   Cardiac Enzymes:  Lab 05/09/12 1750  CKTOTAL 173  CKMB 3.4  CKMBINDEX --  TROPONINI <0.30   BNP:  Lab 05/09/12 1106  PROBNP 2379.0*   Hemoglobin A1C:  Lab 05/09/12 1559  HGBA1C 6.2*   Fasting Lipid Panel:  Lab 05/10/12 0626  CHOL 118  HDL 45  LDLCALC 53  TRIG 100  CHOLHDL 2.6  LDLDIRECT --   Urine Drug Screen: Drugs of  Abuse     Component Value Date/Time   LABOPIA NONE DETECTED 05/10/2012 0932   COCAINSCRNUR NONE DETECTED 05/10/2012 0932   LABBENZ NONE DETECTED 05/10/2012 0932   AMPHETMU NONE DETECTED 05/10/2012 0932   THCU NONE DETECTED 05/10/2012 0932   LABBARB NONE DETECTED 05/10/2012 0932    Studies/Results: Dg Chest 2 View  05/09/2012  *RADIOLOGY REPORT*  Clinical Data: Pain.  Speaking unclearly this morning.  CHEST - 2 VIEW  Comparison: 03/31/2012  Findings: The heart is enlarged.  There are no focal consolidations or pleural effusions.  No edema.  The patient has an inferior vena cava filter.  Surgical clips are identified in the right upper quadrant of the abdomen.  IMPRESSION:  1.  Cardiomegaly. 2.No evidence for acute pulmonary abnormality.  Original Report Authenticated By: Patterson Hammersmith, M.D.   Ct Head Wo Contrast  05/09/2012  *RADIOLOGY REPORT*   Clinical Data: Right hand weakness.  Right-sided facial droop. Headache.  History of hypertension and prostate cancer.  CT HEAD WITHOUT CONTRAST  Technique:  Contiguous axial images were obtained from the base of the skull through the vertex without contrast.  Comparison: 04/05/2012  Findings: Bone windows demonstrate left maxillary sinus mucous retention cyst or polyp. Clear mastoid air cells.  Soft tissue windows demonstrate no  mass lesion, hemorrhage, hydrocephalus, acute infarct, intra-axial, or extra-axial fluid collection.  IMPRESSION: No acute intracranial abnormality.  Original Report Authenticated By: Consuello Bossier, M.D.   Mri Brain Without Contrast  05/09/2012  *RADIOLOGY REPORT*  Clinical Data:  78 year old male with right upper extremity weakness.  Slurred speech.  Atrial fibrillation, rapid heart rate.  Comparison: Head CT 05/09/2012 and earlier.  MRI HEAD WITHOUT CONTRAST  Technique: Multiplanar, multiecho pulse sequences of the brain and surrounding structures were obtained according to standard protocol without intravenous contrast.  Findings: Tiny acute cortically based areas of restricted diffusion are noted along the left motor strip (series 7 images 18 and 26). Diffusion elsewhere is within normal limits.  No mass effect or hemorrhage.  Little to no associated T2 and FLAIR hyperintensity.  Major intracranial vascular flow voids are preserved, see MRA findings below.  No midline shift, ventriculomegaly, mass effect, evidence of mass lesion, extra-axial collection or acute intracranial hemorrhage. Cervicomedullary junction within normal limits.  Normal for age gray and white matter signal outside of the acute findings described earlier.  There is a 7-10 mm T2 hyperintense T1 hypointense intrasellar mass.  Convex upper margin of the pituitary subsequently.  No suprasellar extension or mass effect.  The no involvement of the cavernous sinuses.  Negative for age visualized cervical spine.  Normal  bone marrow signal.  Postoperative changes to the globes. Visualized paranasal sinuses and mastoids are clear.  Negative scalp soft tissues.  IMPRESSION: 1.  Two tiny acute cortically based infarcts along the left motor strip, left MCA territory.  No mass effect or hemorrhage. 2.  MRA findings are below. 3.  Benign-appearing 9 mm pituitary lesion, favor either Rathke cleft cyst or microadenoma, and very likely this is inconsequential.  MRA HEAD WITHOUT CONTRAST  Technique: Angiographic images of the Circle of Willis were obtained using MRA technique without  intravenous contrast.  Findings: Antegrade flow in the posterior circulation with codominant distal vertebral arteries.  Normal left PICA.  Normal vertebrobasilar junction.  No basilar artery stenosis.  Normal AICA vessels.  Normal SCA and PCA origins.  Posterior communicating arteries are diminutive or absent.  Bilateral PCA branches are within normal limits.  Antegrade flow  in both ICA siphons.  No ICA stenosis.  Normal ophthalmic artery origins.  Normal carotid termini, MCA and ACA origins.  Diminutive anterior communicating artery.  Visualized ACA and right MCA branches are within normal limits.  Left MCA origin and M1 segment are within normal limits.  Left MCA bifurcation is patent.  Visualized left MCA branches are within normal limits.  IMPRESSION: Negative intracranial MRA.  Original Report Authenticated By: Harley Hallmark, M.D.   Mr Mra Head/brain Wo Cm  05/09/2012  *RADIOLOGY REPORT*  Clinical Data:  78 year old male with right upper extremity weakness.  Slurred speech.  Atrial fibrillation, rapid heart rate.  Comparison: Head CT 05/09/2012 and earlier.  MRI HEAD WITHOUT CONTRAST  Technique: Multiplanar, multiecho pulse sequences of the brain and surrounding structures were obtained according to standard protocol without intravenous contrast.  Findings: Tiny acute cortically based areas of restricted diffusion are noted along the left motor strip  (series 7 images 18 and 26). Diffusion elsewhere is within normal limits.  No mass effect or hemorrhage.  Little to no associated T2 and FLAIR hyperintensity.  Major intracranial vascular flow voids are preserved, see MRA findings below.  No midline shift, ventriculomegaly, mass effect, evidence of mass lesion, extra-axial collection or acute intracranial hemorrhage. Cervicomedullary junction within normal limits.  Normal for age gray and white matter signal outside of the acute findings described earlier.  There is a 7-10 mm T2 hyperintense T1 hypointense intrasellar mass.  Convex upper margin of the pituitary subsequently.  No suprasellar extension or mass effect.  The no involvement of the cavernous sinuses.  Negative for age visualized cervical spine.  Normal bone marrow signal.  Postoperative changes to the globes. Visualized paranasal sinuses and mastoids are clear.  Negative scalp soft tissues.  IMPRESSION: 1.  Two tiny acute cortically based infarcts along the left motor strip, left MCA territory.  No mass effect or hemorrhage. 2.  MRA findings are below. 3.  Benign-appearing 9 mm pituitary lesion, favor either Rathke cleft cyst or microadenoma, and very likely this is inconsequential.  MRA HEAD WITHOUT CONTRAST  Technique: Angiographic images of the Circle of Willis were obtained using MRA technique without  intravenous contrast.  Findings: Antegrade flow in the posterior circulation with codominant distal vertebral arteries.  Normal left PICA.  Normal vertebrobasilar junction.  No basilar artery stenosis.  Normal AICA vessels.  Normal SCA and PCA origins.  Posterior communicating arteries are diminutive or absent.  Bilateral PCA branches are within normal limits.  Antegrade flow in both ICA siphons.  No ICA stenosis.  Normal ophthalmic artery origins.  Normal carotid termini, MCA and ACA origins.  Diminutive anterior communicating artery.  Visualized ACA and right MCA branches are within normal limits.   Left MCA origin and M1 segment are within normal limits.  Left MCA bifurcation is patent.  Visualized left MCA branches are within normal limits.  IMPRESSION: Negative intracranial MRA.  Original Report Authenticated By: Harley Hallmark, M.D.   Medications: I have reviewed the patient's current medications. Scheduled Meds:   . atorvastatin  40 mg Oral q1800  . digoxin  0.125 mg Oral Daily  . docusate sodium  100 mg Oral BID  . febuxostat  80 mg Oral QPM  . heparin  5,000 Units Subcutaneous Q8H  . levothyroxine  75 mcg Oral Q breakfast  . loratadine  10 mg Oral Daily  . multivitamin with minerals  1 tablet Oral QPM  . omega-3 acid ethyl esters  1 g Oral Daily  .  DISCONTD: aspirin  325 mg Oral Daily  . DISCONTD: docusate sodium  100 mg Oral BID  . DISCONTD: fish oil-omega-3 fatty acids  1 g Oral Daily  . DISCONTD: guar gum  1 packet Oral Once  . DISCONTD: metoprolol  5 mg Intravenous Once   Continuous Infusions:   . DISCONTD: sodium chloride 125 mL/hr at 05/09/12 1121   PRN Meds:.acetaminophen, albuterol, polyvinyl alcohol, sodium chloride, DISCONTD: Polyethyl Glycol-Propyl Glycol, DISCONTD: sodium chloride  Assessment/Plan: Mr. Danielsen is a 78 year old pleasant man presenting with extensive PMH significant for CAD, heart failure EF:40-45% (04/07/12), Afib not on anticoagulation, DVT/PE with IVC filter, and chronic renal insufficiency presenting to the ED with right sided arm weakness, slurred speech, and right sided facial droop this morning. Symptoms as per family started upon awakening day of admission, started with right arm weakness which resolved in less than an hour followed by slurred speech and facial droop which also resolved by the time of admission. CT head negative for any acute intracranial abnormality. MRI/MRA + two tiny acute cortically based infarcts along L motor strip, left MCA territory, no mass effect or hemorrhage, and benign appearing 9mm pituitary lesion--Rathke  cleft cyst or microadenoma likely inconsequential.  Mr. Sheeran current presentation appears to be secondary to stroke most likely embolic in nature in the setting of AFIB without anticoagulation with worsening CHF. In regards to shortness of breath PE is included in the differential due to previous hx of DVT and PE, tachycardia on admission, age, limited mobility of RUE s/p fracture with geneva score of 9-11 showing intermediate probability (28%). However, PE could be less likely due to existing IVC filter, worsening shortness of breath for several months and not acute and also appears more attributed to CHF with no outright chest pain complaints but does claim chest tightness. Additionally, Mr. Knipple has chronic renal insufficiency which would be difficult to administer contrast dye for CT angio chest. COPD exacerbation may also account for SOB as Mr. Crable is on 3L o2 at home, however, lung sounds are clear b/l on exam, and cxr shows no evidence of acute pulmonary abnormality. Worsening CHF is most likely the contributing factor for SOB with EF of 40-45%, cardiomegaly, complaints of recent leg edema, and elevated BNP.   1)  Ischemic Stroke--likely embolic in nature secondary to AFIB no anticoagulation.  Presented with transient right arm weakness, right sided facial droop, and slurred speech morning of admission. ABCD2 score of 6 = 8.1% stroke risk. CT head negative for hemorrhage or acute intracranial abnormality. MRI/MRA positive for two tiny acute cortically based infarcts along left MCA territory  -f/u TEE--likely Monday 05/11/12, previous echo 04/07/12 with LVEF 40-45%  -f/u carotid dopplers  -f/u EKG in AM--92bpm, aifb, RBBB, T wave changes in lateral leads -d/c ASA -Start Coumadin per pharmacy protocol today -neuro checks with vital signs  -pulse oximetry--sat 100% on NC3L  -Continue heart healthy diet since passed speech and swallow in ED  -I/O  -CBG  -risk stratification: f/u  HbA1c, fasting lipid panel, UDS  -Lipitor 40mg  QD  -Tylenol PRN  -cardiac enzymes x1 negative, CK MB:3.4   2) HTN (hypertension)--135/65 on admission. On Coreg 3.125mg  BID and Verapamil 80mg  BID at home  -hold HTN meds for now in setting of stroke -hold lasix for now -monitor BP   3) Dyspnea--could be attributed to worsening CHF vs. PE (geneva score of 9-11) but present IVC filter, vs. COPD exacerbation  -continue O2 via [place] 3L--same as home dose  -  monitor vitals and o2 sat, sat 100% on [place] 3L -discussed with Mr. Vacha and family in regards to possibility of getting CT angio chest for r/o PE but risk of contrast dye worsening renal function in setting of chronic renal insufficiency; family would like to hold off on getting CT chest with dye at this time given the risks but will continue to discuss.  -d/c albuterol nebz--patient does not like it, and d/c in regards to tachycardia  4) Chronic renal insufficiency--BUN/Cr=32/1.6 on admission, previous renal function from 03/31/12: BUN/Cr=51/1.7  -F/U BMP--29/1.5  -Hold IVF fluids at this time given setting of CHF   5) CHF--previous BNP of 3780 (03/31/12) with EF of 40-45% (04/07/12), worsening sob and b/l leg edema  -f/u cardiac enzymes negative x1  -f/u TEE likely Monday, last EF 40-45% 04/07/12  -f/u records from his Cardiologist Dr. Annamaria Boots left with office staff  -hold Lasix for now given setting of stroke-- home dose: 80mg  PO BID  -ProBNP--2379 -Start Digoxin 0.125mg  PO QD   6) Hx of prostate cancer--s/p surgery; 2003, family claims oncologist may have recently found new malignant prostate cells but unsure of exact specifics, will f/u as outpatient  -stable  -follows up with oncologist in [place], [place]   7) Hx of b/l hip arthroplasty 2009 (L) and 2010 (R)  -stable  -walk with assistance if gait unstable  -PT/OT   8) Hypothyroidism--on synthroid  -stable  -continue synthroid home dose QD   9) Hyperlipidemia--on  simvastatin 80mg  QD at home  -f/u lipid panel: Chol 118, TG:100, HDL:45, LDL:53, VLDL:20 -Continue Lipitor   10) Hx of b/l cataract surgery and recent b/l eyelid lift  -vision stable  -follow up with ophthalmologist as outpatient   11) Hx of Pulmonary Embolism--IVC filter in place, not on current anticoagulation, was on Coumadin in the past but claims had reaction with sulfa drugs leading to major GIB, and stopped anticoagulation at this time. Wears compression stockkings  -see #3  -Geneva score 9-11 at this time, intermediate probability  -consider CT angio chest but with caution considering chronic renal insufficiency and risks of contrast dye  -discussed with Mr. Sainvil and family in regards to possibility of getting CT angio chest for r/o PE but risk of contrast dye worsening renal function in setting of chronic renal insufficiency; family would like to hold off on getting CT chest with dye at this time given the risks but will continue to discuss--no CT angio chest at this time  12) AFIB--reported by family to have for past 2 years, not on anticoagulation. Fluctuating tachycardia, worse upon movement, stable at rest HR~100's, asymptomatic -Will start Digoxin today 0.125mg  PO QD -Will start Coumadin today as per pharmacy protocol--family discussed in length and have decided to proceed with anticoagulation at this time -f/u coagulation panel -if HR unstable with persistent tachycardia and symptoms, consider starting low dose metoprolol once  -TEE likely Monday  Diet: Heart healthy  DVT prophylaxis: on Coumadin for AFIB Dispo: pending clinical improvement, laboratory results, stroke and cardiac workup   LOS: 1 day   Darden Palmer 05/10/2012, 11:55 AM

## 2012-05-11 ENCOUNTER — Encounter (HOSPITAL_COMMUNITY)
Admission: EM | Disposition: A | Discharge status: Home / Self Care | Payer: Self-pay | Source: Home / Self Care | Attending: Emergency Medicine

## 2012-05-11 ENCOUNTER — Encounter (HOSPITAL_COMMUNITY): Payer: Self-pay | Admitting: *Deleted

## 2012-05-11 HISTORY — PX: TEE WITHOUT CARDIOVERSION: SHX5443

## 2012-05-11 LAB — PROTIME-INR: INR: 0.94 (ref 0.00–1.49)

## 2012-05-11 SURGERY — ECHOCARDIOGRAM, TRANSESOPHAGEAL
Anesthesia: Moderate Sedation

## 2012-05-11 MED ORDER — ASPIRIN 81 MG PO CHEW
81.0000 mg | CHEWABLE_TABLET | Freq: Every day | ORAL | Status: DC
  Administered 2012-05-11: 81 mg via ORAL
  Filled 2012-05-11: qty 1

## 2012-05-11 MED ORDER — CARVEDILOL 3.125 MG PO TABS
3.1250 mg | ORAL_TABLET | Freq: Two times a day (BID) | ORAL | Status: DC
  Administered 2012-05-11: 3.125 mg via ORAL
  Filled 2012-05-11 (×2): qty 1

## 2012-05-11 MED ORDER — ENOXAPARIN SODIUM 80 MG/0.8ML ~~LOC~~ SOLN
80.0000 mg | Freq: Two times a day (BID) | SUBCUTANEOUS | Status: DC
  Filled 2012-05-11 (×2): qty 0.8

## 2012-05-11 MED ORDER — MIDAZOLAM HCL 5 MG/ML IJ SOLN
INTRAMUSCULAR | Status: AC
  Filled 2012-05-11: qty 2

## 2012-05-11 MED ORDER — FENTANYL CITRATE 0.05 MG/ML IJ SOLN
INTRAMUSCULAR | Status: AC
  Filled 2012-05-11: qty 2

## 2012-05-11 MED ORDER — SODIUM CHLORIDE 0.9 % IV SOLN
Freq: Once | INTRAVENOUS | Status: AC
  Administered 2012-05-11: 500 mL via INTRAVENOUS

## 2012-05-11 MED ORDER — DIGOXIN 125 MCG PO TABS: 0.1250 mg | ORAL_TABLET | ORAL | Status: DC

## 2012-05-11 MED ORDER — WARFARIN SODIUM 6 MG PO TABS
6.0000 mg | ORAL_TABLET | Freq: Once | ORAL | Status: AC
  Administered 2012-05-11: 6 mg via ORAL
  Filled 2012-05-11: qty 1

## 2012-05-11 NOTE — Progress Notes (Signed)
Subjective: Manuel [name] was seen and examined at bedside today.  He claims to be feeling well, slept well throughout the night, had 2-3 BM solid last night, and tolerated diet well.  He still gets worsening shortness of breath with movement and increase in HR from 130-160s but comes down right away and he remains asymptomatic.  He is scheduled for TEE today and had carotid dopplers completed yesterday with preliminary negative report for any stenosis.  Manuel [name] and his [name] also decided to proceed with anticoagulation yesterday.  As such, he was started on Coumadin and has been tolerating it well.  He denies any current fever, chills, nausea, vomiting, diarrhea, abdominal pain, or chest pain at this time.     Objective: Vital signs in last 24 hours: Filed Vitals:   05/10/12 1826 05/10/12 2200 05/11/12 0200 05/11/12 0600  BP: 126/96 133/80 116/71 136/73  Pulse: 76 73 84 98  Temp: 98.1 F (36.7 C) 98.1 F (36.7 C) 97.8 F (36.6 C) 97.6 F (36.4 C)  TempSrc: Oral Oral Oral Oral  Resp: 19 18 18 18   Height:      Weight:      SpO2: 100% 98% 99% 98%   Weight change:   Intake/Output Summary (Last 24 hours) at 05/11/12 0839 Last data filed at 05/10/12 1019  Gross per 24 hour  Intake    240 ml  Output      0 ml  Net    240 ml   Physical Exam:  Vitals reviewed.  General: resting in bed, NAD  HEENT: PERRLA, EOMI, no scleral icterus  Cardiac:Irregularly irregular, tachycardia, + 1-2/6 systolic murmur, no rubs or gallops. +JVD  Pulm: clear to auscultation bilaterally, no wheezes, rales, or rhonchi  Abd: soft, nontender, nondistended, +BS present  Ext: + 2 pitting edema b/l lower extremities, + right wrist brace in place, +2dp b/l, warm and well perfused, no pedal edema, + multiple ecchymosis on b/l upper extremities.  Neuro: alert and oriented X3, cranial nerves II-XII grossly intact, sensation to light touch equal in bilateral upper and lower extremities. Strength weaker in RUE  than LUE, limited RUE mobility due to recent fracture.   Lab Results: Basic Metabolic Panel:  Lab 05/10/12 1610 05/09/12 1559 05/09/12 1102  NA 144 -- 143  K 3.7 -- 3.9  CL 108 -- 108  CO2 28 -- 25  GLUCOSE 94 -- 98  BUN 29* -- 32*  CREATININE 1.50* 1.60* --  CALCIUM 9.2 -- 8.9  MG -- -- --  PHOS -- -- --   Liver Function Tests:  Lab 05/09/12 1102  AST 27  ALT 29  ALKPHOS 63  BILITOT 0.3  PROT 6.1  ALBUMIN 3.1*   CBC:  Lab 05/10/12 0626 05/09/12 1559 05/09/12 1102  WBC 8.3 9.6 --  NEUTROABS -- -- 6.4  HGB 12.8* 13.2 --  HCT 37.7* 38.7* --  MCV 101.9* 101.0* --  PLT 164 205 --   Cardiac Enzymes:  Lab 05/09/12 1750  CKTOTAL 173  CKMB 3.4  CKMBINDEX --  TROPONINI <0.30   BNP:  Lab 05/09/12 1106  PROBNP 2379.0*   Hemoglobin A1C:  Lab 05/09/12 1559  HGBA1C 6.2*   Fasting Lipid Panel:  Lab 05/10/12 0626  CHOL 118  HDL 45  LDLCALC 53  TRIG 100  CHOLHDL 2.6  LDLDIRECT --   Urine Drug Screen: Drugs of Abuse     Component Value Date/Time   LABOPIA NONE DETECTED 05/10/2012 0932   COCAINSCRNUR NONE DETECTED 05/10/2012  0932   LABBENZ NONE DETECTED 05/10/2012 0932   AMPHETMU NONE DETECTED 05/10/2012 0932   THCU NONE DETECTED 05/10/2012 0932   LABBARB NONE DETECTED 05/10/2012 0932    Studies/Results: Dg Chest 2 View  05/09/2012  *RADIOLOGY REPORT*  Clinical Data: Pain.  Speaking unclearly this morning.  CHEST - 2 VIEW  Comparison: 03/31/2012  Findings: The heart is enlarged.  There are no focal consolidations or pleural effusions.  No edema.  The patient has an inferior vena cava filter.  Surgical clips are identified in the right upper quadrant of the abdomen.  IMPRESSION:  1.  Cardiomegaly. 2.No evidence for acute pulmonary abnormality.  Original Report Authenticated By: Patterson Hammersmith, M.D.   Ct Head Wo Contrast  05/09/2012  *RADIOLOGY REPORT*  Clinical Data: Right hand weakness.  Right-sided facial droop. Headache.  History of hypertension and  prostate cancer.  CT HEAD WITHOUT CONTRAST  Technique:  Contiguous axial images were obtained from the base of the skull through the vertex without contrast.  Comparison: 04/05/2012  Findings: Bone windows demonstrate left maxillary sinus mucous retention cyst or polyp. Clear mastoid air cells.  Soft tissue windows demonstrate no  mass lesion, hemorrhage, hydrocephalus, acute infarct, intra-axial, or extra-axial fluid collection.  IMPRESSION: No acute intracranial abnormality.  Original Report Authenticated By: Consuello Bossier, M.D.   Mri Brain Without Contrast  05/09/2012  *RADIOLOGY REPORT*  Clinical Data:  78 year old male with right upper extremity weakness.  Slurred speech.  Atrial fibrillation, rapid heart rate.  Comparison: Head CT 05/09/2012 and earlier.  MRI HEAD WITHOUT CONTRAST  Technique: Multiplanar, multiecho pulse sequences of the brain and surrounding structures were obtained according to standard protocol without intravenous contrast.  Findings: Tiny acute cortically based areas of restricted diffusion are noted along the left motor strip (series 7 images 18 and 26). Diffusion elsewhere is within normal limits.  No mass effect or hemorrhage.  Little to no associated T2 and FLAIR hyperintensity.  Major intracranial vascular flow voids are preserved, see MRA findings below.  No midline shift, ventriculomegaly, mass effect, evidence of mass lesion, extra-axial collection or acute intracranial hemorrhage. Cervicomedullary junction within normal limits.  Normal for age gray and white matter signal outside of the acute findings described earlier.  There is a 7-10 mm T2 hyperintense T1 hypointense intrasellar mass.  Convex upper margin of the pituitary subsequently.  No suprasellar extension or mass effect.  The no involvement of the cavernous sinuses.  Negative for age visualized cervical spine.  Normal bone marrow signal.  Postoperative changes to the globes. Visualized paranasal sinuses and mastoids  are clear.  Negative scalp soft tissues.  IMPRESSION: 1.  Two tiny acute cortically based infarcts along the left motor strip, left MCA territory.  No mass effect or hemorrhage. 2.  MRA findings are below. 3.  Benign-appearing 9 mm pituitary lesion, favor either Rathke cleft cyst or microadenoma, and very likely this is inconsequential.  MRA HEAD WITHOUT CONTRAST  Technique: Angiographic images of the Circle of Willis were obtained using MRA technique without  intravenous contrast.  Findings: Antegrade flow in the posterior circulation with codominant distal vertebral arteries.  Normal left PICA.  Normal vertebrobasilar junction.  No basilar artery stenosis.  Normal AICA vessels.  Normal SCA and PCA origins.  Posterior communicating arteries are diminutive or absent.  Bilateral PCA branches are within normal limits.  Antegrade flow in both ICA siphons.  No ICA stenosis.  Normal ophthalmic artery origins.  Normal carotid termini, MCA and ACA origins.  Diminutive anterior communicating artery.  Visualized ACA and right MCA branches are within normal limits.  Left MCA origin and M1 segment are within normal limits.  Left MCA bifurcation is patent.  Visualized left MCA branches are within normal limits.  IMPRESSION: Negative intracranial MRA.  Original Report Authenticated By: Harley Hallmark, M.D.   Mr Mra Head/brain Wo Cm  05/09/2012  *RADIOLOGY REPORT*  Clinical Data:  78 year old male with right upper extremity weakness.  Slurred speech.  Atrial fibrillation, rapid heart rate.  Comparison: Head CT 05/09/2012 and earlier.  MRI HEAD WITHOUT CONTRAST  Technique: Multiplanar, multiecho pulse sequences of the brain and surrounding structures were obtained according to standard protocol without intravenous contrast.  Findings: Tiny acute cortically based areas of restricted diffusion are noted along the left motor strip (series 7 images 18 and 26). Diffusion elsewhere is within normal limits.  No mass effect or  hemorrhage.  Little to no associated T2 and FLAIR hyperintensity.  Major intracranial vascular flow voids are preserved, see MRA findings below.  No midline shift, ventriculomegaly, mass effect, evidence of mass lesion, extra-axial collection or acute intracranial hemorrhage. Cervicomedullary junction within normal limits.  Normal for age gray and white matter signal outside of the acute findings described earlier.  There is a 7-10 mm T2 hyperintense T1 hypointense intrasellar mass.  Convex upper margin of the pituitary subsequently.  No suprasellar extension or mass effect.  The no involvement of the cavernous sinuses.  Negative for age visualized cervical spine.  Normal bone marrow signal.  Postoperative changes to the globes. Visualized paranasal sinuses and mastoids are clear.  Negative scalp soft tissues.  IMPRESSION: 1.  Two tiny acute cortically based infarcts along the left motor strip, left MCA territory.  No mass effect or hemorrhage. 2.  MRA findings are below. 3.  Benign-appearing 9 mm pituitary lesion, favor either Rathke cleft cyst or microadenoma, and very likely this is inconsequential.  MRA HEAD WITHOUT CONTRAST  Technique: Angiographic images of the Circle of Willis were obtained using MRA technique without  intravenous contrast.  Findings: Antegrade flow in the posterior circulation with codominant distal vertebral arteries.  Normal left PICA.  Normal vertebrobasilar junction.  No basilar artery stenosis.  Normal AICA vessels.  Normal SCA and PCA origins.  Posterior communicating arteries are diminutive or absent.  Bilateral PCA branches are within normal limits.  Antegrade flow in both ICA siphons.  No ICA stenosis.  Normal ophthalmic artery origins.  Normal carotid termini, MCA and ACA origins.  Diminutive anterior communicating artery.  Visualized ACA and right MCA branches are within normal limits.  Left MCA origin and M1 segment are within normal limits.  Left MCA bifurcation is patent.   Visualized left MCA branches are within normal limits.  IMPRESSION: Negative intracranial MRA.  Original Report Authenticated By: Harley Hallmark, M.D.   Medications: I have reviewed the patient's current medications. Scheduled Meds:    . atorvastatin  40 mg Oral q1800  . coumadin book   Does not apply Once  . digoxin  0.125 mg Oral Daily  . docusate sodium  100 mg Oral BID  . febuxostat  80 mg Oral QPM  . levothyroxine  75 mcg Oral Q breakfast  . loratadine  10 mg Oral Daily  . multivitamin with minerals  1 tablet Oral QPM  . omega-3 acid ethyl esters  1 g Oral Daily  . warfarin  6 mg Oral ONCE-1800  . warfarin   Does not apply Once  .  Warfarin - Pharmacist Dosing Inpatient   Does not apply q1800  . DISCONTD: aspirin  325 mg Oral Daily  . DISCONTD: heparin  5,000 Units Subcutaneous Q8H   Continuous Infusions:  PRN Meds:.acetaminophen, polyvinyl alcohol, sodium chloride, DISCONTD: albuterol  Assessment/Plan: Manuel [name] is a 78 year old pleasant man presenting with extensive PMH significant for CAD, heart failure EF:40-45% (04/07/12), Afib not on anticoagulation, DVT/PE with IVC filter, and chronic renal insufficiency presenting to the ED with right sided arm weakness, slurred speech, and right sided facial droop this morning. Symptoms as per [name] started upon awakening day of admission, started with right arm weakness which resolved in less than an hour followed by slurred speech and facial droop which also resolved by the time of admission. CT head negative for any acute intracranial abnormality. MRI/MRA + two tiny acute cortically based infarcts along L motor strip, left MCA territory, no mass effect or hemorrhage, and benign appearing 9mm pituitary lesion--Rathke cleft cyst or microadenoma likely inconsequential.  Manuel [name] current presentation appears to be secondary to stroke most likely embolic in nature in the setting of AFIB without anticoagulation with worsening CHF. In  regards to shortness of breath PE is included in the differential due to previous hx of DVT and PE, tachycardia on admission, age, limited mobility of RUE s/p fracture with geneva score of 9-11 showing intermediate probability (28%). However, PE could be less likely due to existing IVC filter, worsening shortness of breath for several months and not acute and also appears more attributed to CHF with no outright chest pain complaints but does claim chest tightness. Additionally, Manuel [name] has chronic renal insufficiency which would be difficult to administer contrast dye for CT angio chest. COPD exacerbation may also account for SOB as Manuel [name] is on 3L o2 at home, however, lung sounds are clear b/l on exam, and cxr shows no evidence of acute pulmonary abnormality. Worsening CHF is most likely the contributing factor for SOB with EF of 40-45%, cardiomegaly, complaints of recent leg edema, and elevated BNP.   1)  Ischemic Stroke--likely embolic in nature secondary to AFIB no anticoagulation.  Presented with transient right arm weakness, right sided facial droop, and slurred speech morning of admission. ABCD2 score of 6 = 8.1% stroke risk. CT head negative for hemorrhage or acute intracranial abnormality. MRI/MRA positive for two tiny acute cortically based infarcts along left MCA territory  -f/u TEE--today, previous echo 04/07/12 with LVEF 40-45%  -f/u carotid dopplers--preliminary negative for ICA stenosis, antegrade vertebral artery flow  -resume ASA 81mg  until coumadin therapeutic -Continue Coumadin per pharmacy protocol -neuro checks with vital signs  -pulse oximetry -Continue heart healthy diet since passed speech and swallow in ED  -I/O  -CBG  -risk stratification: f/u HbA1c, fasting lipid panel, UDS  -Lipitor 40mg  QD  -Tylenol PRN  -cardiac enzymes x1 negative, CK MB:3.4   2) HTN (hypertension)--135/65 on admission. On Coreg 3.125mg  BID and Verapamil 80mg  BID at home  -restart Coreg  today -hold Verapamil and Lasix at this time -monitor BP   3) Dyspnea--could be attributed to worsening CHF vs. PE (geneva score of 9-11) but present IVC filter, vs. COPD exacerbation  -continue O2 via [place] 3L--same as home dose  -monitor vitals and o2 sat, sat 100% on [place] 3L -discussed with Manuel [name] in regards to possibility of getting CT angio chest for r/o PE but risk of contrast dye worsening renal function in setting of chronic renal insufficiency; [name] would like to  hold off on getting CT chest with dye at this time given the risks   4) Chronic renal insufficiency--BUN/Cr=32/1.6 on admission, previous renal function from 03/31/12: BUN/Cr=51/1.7  -F/U BMP--29/1.5   5) CHF--previous BNP of 3780 (03/31/12) with EF of 40-45% (04/07/12), worsening sob and b/l leg edema  -f/u cardiac enzymes negative x1  -f/u TEE today, last EF 40-45% 04/07/12  -f/u records from his Cardiologist Dr. Annamaria Boots left with office staff  -hold Lasix for now given setting of stroke-- home dose: 80mg  PO BID  -ProBNP--2379 -d/c Digoxin--didn't help much with tachycardia. Restart Coreg today -keep legs elevated and continue to use stockings  6) Hx of prostate cancer--s/p surgery; 2003, [name] claims oncologist may have recently found new malignant prostate cells but unsure of exact specifics, will f/u as outpatient  -stable  -follows up with oncologist in [place], [place]  -consider starting bisphosphonates with PCP due to recent fracture and being on Lupron  7) Hx of b/l hip arthroplasty 2009 (L) and 2010 (R)  -stable  -walk with assistance if gait unstable  -PT/OT   8) Hypothyroidism--on synthroid  -stable  -continue synthroid home dose QD   9) Hyperlipidemia--on simvastatin 80mg  QD at home  -f/u lipid panel: Chol 118, TG:100, HDL:45, LDL:53, VLDL:20 -Continue Lipitor   10) Hx of b/l cataract surgery and recent b/l eyelid lift  -vision stable  -follow up with ophthalmologist as  outpatient   11) Hx of Pulmonary Embolism--IVC filter in place, not on current anticoagulation, was on Coumadin in the past but claims had reaction with sulfa drugs leading to major GIB, and stopped anticoagulation at this time. Wears compression stockkings  -see #3  -Geneva score 9-11 at this time, intermediate probability  -consider CT angio chest but with caution considering chronic renal insufficiency and risks of contrast dye  -discussed with Manuel [name] and [name] in regards to possibility of getting CT angio chest for r/o PE but risk of contrast dye worsening renal function in setting of chronic renal insufficiency; [name] would like to hold off on getting CT chest with dye at this time given the risks but will continue to discuss--no CT angio chest at this time  12) AFIB--reported by [name] to have for past 2 years, not on anticoagulation. Fluctuating tachycardia, worse upon movement, stable at rest HR~100's, asymptomatic -d/c Digoxin today--not much improvement with tachycardia -Continue coumadin today as per pharmacy protocol--[name] discussed in length and have decided to proceed with anticoagulation at this time -f/u coagulation panel--INR:0.94 PT:12.8 -if HR unstable with persistent tachycardia and symptoms, consider starting low dose metoprolol once  -TEE today  Diet: NPO for TEE then Heart healthy  DVT prophylaxis: Lovenox Dispo: pending clinical improvement, laboratory results, stroke and cardiac workup   LOS: 2 days   Darden Palmer 05/11/2012, 8:39 AM

## 2012-05-11 NOTE — Care Management Note (Signed)
    Page 1 of 1   05/11/2012     4:18:08 PM   CARE MANAGEMENT NOTE 05/11/2012  Patient:  Manuel [name], Manuel [name]   Account Number:  [id number]  Date Initiated:  05/11/2012  Documentation initiated by:  Onnie Boer  Subjective/Objective Assessment:   PT WAS ADMITTED FOR TIA S/S     Action/Plan:   PROGRESSION OF CARE AND DISCHARGE PLANNING   Anticipated DC Date:  05/11/2012   Anticipated DC Plan:  HOME/SELF CARE      DC Planning Services  CM consult      Choice offered to / List presented to:             Status of service:  Completed, signed off Medicare Important Message given?   (If response is "NO", the following Medicare IM given date fields will be blank) Date Medicare IM given:   Date Additional Medicare IM given:    Discharge Disposition:  HOME/SELF CARE  Per UR Regulation:  Reviewed for med. necessity/level of care/duration of stay  If discussed at Long Length of Stay Meetings, dates discussed:    Comments:  05/11/12 Onnie Boer, RN, BSN 1616 PT WAS ADMITTED FOR TIA S/S AND WILL BE DC'D TO HOME TODAY WITH SELF CARE.

## 2012-05-11 NOTE — Progress Notes (Addendum)
ANTICOAGULATION CONSULT NOTE - Follow Up Consult  Pharmacy Consult for Coumadin - -Adding Lovenox Indication: atrial fibrillation, acute embolic CVA this admission  Addendum:  Requested to add SQ Lovenox to his regimen until he is therapeutic.  His weight is 81.5 kg, Creatinine = 1.5, CrCl = 35 ml/min and age = 78yo.  H/H stable and Plt. = 164K.  Plan:  Will add Lovenox 80 mg q12 hours and monitor both renal function and CBC closely.  He is borderline for needing renal adjusted Lovenox dosing.   Allergies  Allergen Reactions  . Morphine And Related Nausea And Vomiting   Patient Measurements: Height: 5\' 7"  (170.2 cm) Weight: 179 lb 10.8 oz (81.5 kg) IBW/kg (Calculated) : 66.1   Vital Signs: Temp: 97.8 F (36.6 C) (08/19 0941) Temp src: Oral (08/19 0941) BP: 128/77 mmHg (08/19 0941) Pulse Rate: 100  (08/19 0941)  Labs:  Basename 05/11/12 0512 05/10/12 1143 05/10/12 0626 05/09/12 1750 05/09/12 1559 05/09/12 1102  HGB -- -- 12.8* -- 13.2 --  HCT -- -- 37.7* -- 38.7* 39.1  PLT -- -- 164 -- 205 178  APTT -- -- -- -- -- --  LABPROT 12.8 12.8 -- -- -- --  INR 0.94 0.94 -- -- -- --  HEPARINUNFRC -- -- -- -- -- --  CREATININE -- -- 1.50* -- 1.60* 1.59*  CKTOTAL -- -- -- 173 -- --  CKMB -- -- -- 3.4 -- --  TROPONINI -- -- -- <0.30 -- --   Medical History: Past Medical History  Diagnosis Date  . Pulmonary embolism   . COPD (chronic obstructive pulmonary disease)   . Hypertension    Medications:  Prescriptions prior to admission  Medication Sig Dispense Refill  . albuterol (PROVENTIL) (2.5 MG/3ML) 0.083% nebulizer solution Take 2.5 mg by nebulization 3 (three) times daily.      . carvedilol (COREG) 3.125 MG tablet Take 3.125 mg by mouth 2 (two) times daily with a meal.      . cyanocobalamin (,VITAMIN B-12,) 1000 MCG/ML injection Inject 1,000 mcg into the muscle every 30 (thirty) days.      . febuxostat (ULORIC) 40 MG tablet Take 80 mg by mouth every evening.      . fish  oil-omega-3 fatty acids 1000 MG capsule Take 1 g by mouth daily.      . furosemide (LASIX) 80 MG tablet Take 80 mg by mouth 2 (two) times daily.      . lansoprazole (PREVACID) 30 MG capsule Take 30 mg by mouth every evening.      Marland Kitchen levothyroxine (SYNTHROID, LEVOTHROID) 75 MCG tablet Take 75 mcg by mouth daily.      Marland Kitchen loratadine (CLARITIN) 10 MG tablet Take 10 mg by mouth daily.      . Multiple Vitamin (MULTIVITAMIN WITH MINERALS) TABS Take 1 tablet by mouth every evening.      Bertram Gala Glycol-Propyl Glycol (SYSTANE) 0.4-0.3 % SOLN Apply 1 drop to eye daily as needed. For dry eyes      . ranolazine (RANEXA) 500 MG 12 hr tablet Take 500 mg by mouth 2 (two) times daily.      . simvastatin (ZOCOR) 80 MG tablet Take 80 mg by mouth every evening.      . sodium chloride (OCEAN) 0.65 % nasal spray Place 1 spray into the nose as needed. For congestion      . verapamil (CALAN) 80 MG tablet Take 80 mg by mouth 2 (two) times daily.      Marland Kitchen  Wheat Dextrin (BENEFIBER PO) Take 1 tablet by mouth daily.       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 Patient was on Coumadin ~7 years ago for a PE and had a massive GI bleed thought to be due to combination of Coumadin and sulfa antibiotic. Since then he developed Afib but was not put on anticoagulation due to previous GI bleed. Pharmacy consulted to begin Coumadin. INR is normal at baseline. No CBC today but his H/H were stable yesterday and there has been no bleeding noted.  INR = 0.94 which is unchanged from yesterday.  He will get his second dose today.  There is no noted bleeding complications with his anticoagulation.  Medication Review:  I have reviewed his current medications and there are none currently that should cause a major drug/drug interaction with his anticoagulation.  Goal of Therapy:  INR 2-3   Plan:  -Coumadin 6 mg po tonight -INR daily  Nadara Mustard, PharmD., MS Clinical Pharmacist Pager:  [phone]  Thank you for allowing pharmacy to be part of this patients care team. 05/11/2012 11:25 AM

## 2012-05-11 NOTE — Discharge Summary (Signed)
Internal Medicine [facility] Discharge Note  Name: Manuel [name] MRN: [id number] DOB: May 17, 1922 78 y.o.  Date of Admission: 05/09/2012  9:21 AM Date of Discharge: 05/11/2012 Attending Physician: Burns Spain, MD  Discharge Diagnosis: Principal Problem:  *Stroke Active Problems:  HTN (hypertension)  Dyspnea  hx of Afib not on anticoagulation  Tachycardia  CHF (congestive heart failure)  Chronic renal insufficiency  Hx of Pulmonary embolism  Hyperlipidemia  Hx of Prostate cancer  S/P bilateral hip replacements  Hx of cataract surgery  Hypothyroidism  Discharge Medications: Medication List  As of 05/11/2012  3:04 PM   STOP taking these medications         furosemide 80 MG tablet      ranolazine 500 MG 12 hr tablet      verapamil 80 MG tablet         TAKE these medications         albuterol (2.5 MG/3ML) 0.083% nebulizer solution   Commonly known as: PROVENTIL   Take 2.5 mg by nebulization 3 (three) times daily.      BENEFIBER PO   Take 1 tablet by mouth daily.      carvedilol 3.125 MG tablet   Commonly known as: COREG   Take 3.125 mg by mouth 2 (two) times daily with a meal.      cyanocobalamin 1000 MCG/ML injection   Commonly known as: (VITAMIN B-12)   Inject 1,000 mcg into the muscle every 30 (thirty) days.      febuxostat 40 MG tablet   Commonly known as: ULORIC   Take 80 mg by mouth every evening.      fish oil-omega-3 fatty acids 1000 MG capsule   Take 1 g by mouth daily.      lansoprazole 30 MG capsule   Commonly known as: PREVACID   Take 30 mg by mouth every evening.      levothyroxine 75 MCG tablet   Commonly known as: SYNTHROID, LEVOTHROID   Take 75 mcg by mouth daily.      loratadine 10 MG tablet   Commonly known as: CLARITIN   Take 10 mg by mouth daily.      multivitamin with minerals Tabs   Take 1 tablet by mouth every evening.      simvastatin 80 MG tablet   Commonly known as: ZOCOR   Take 80 mg by mouth  every evening.      sodium chloride 0.65 % nasal spray   Commonly known as: OCEAN   Place 1 spray into the nose as needed. For congestion      SYSTANE 0.4-0.3 % Soln   Generic drug: Polyethyl Glycol-Propyl Glycol   Apply 1 drop to eye daily as needed. For dry eyes              Please take: Aspirin 81mg  PO QD until therapeutic INR--[name] and family advised to take Aspirin daily, notified it is found OTC, did not prescribe because daughter said she has it at home and will pick up new bottle on the way home from hospital as well if need be and [name] states he has aspirin at home as well.    Disposition and follow-up:   Mr.Osamah D Sannes was discharged from [facility] in Stable condition.  At the hospital follow up visit please address Coumadin therapy, INR levels, stroke follow up--signs of weakness, bleeding, blood pressure, Afib treatment--rhythm and rate control, and CHF status--BP medications in setting of  stroke and Lasix.   Follow-up Appointments: Follow-up Information    Follow up with [facility], MD on 05/12/2012. (130pm 05/12/12)    Contact information:   [address] [place] [address] [phone]         Discharge Orders    Future Orders Please Complete By Expires   Diet - low sodium heart healthy      Increase activity slowly      Comments:   Walk with assistance. High falls risk.   Discharge instructions      Comments:   Please follow up with Dr. Tomasa Blase as soon as possible--I scheduled you for a follow up appointment at 130pm with Dr. Tomasa Blase on 05/12/12--Follow up INR levels and Coumadin evaluation.  Discuss with your PCP in regards to rate and rhythm control.  Hold your Lasix and Verapamil at this time--but go over these medications with your PCP and follow their recommendations as your bp needs to be controlled long term.  Continue wearing your stockings for edema relief.      Continue taking Coreg  and Coumadin at this time--If you notice any bleeding or have any questions, contact PCP right away or call or come to Emergency room.  Continue taking Aspirin 81mg  PO daily--can be found over the counter, until coumadin levels are therapeutic--please discuss this with your PCP as well.  Consider getting another echocardiogram as an outpatient and discuss CHF status with Dr. Dulce Sellar and Dr. Tomasa Blase.  Your TEE was cancelled today due to already starting Coumadin and TEE findings likely not changing management.  It appears that CHF may be worsening which is causing more of the leg edema and shortness of breath, and thus, needs to be followed up closely.    Follow PT recommendations. ALWAYS walk with assistance please--high falls risk and on Coumadin risk of bleeding.   Any new signs of stroke, weakness, come to ED immediately.    It was a pleasure having you and your family here, please take care of yourself.  Let us know if you have any questions or concerns.   Call MD for:  persistant dizziness or light-headedness      Call MD for:  extreme fatigue      Call MD for:  temperature >100.4      Call MD for:  difficulty breathing, headache or visual disturbances      Call MD for:      Comments:   Notice any bleeding     Procedures Performed:  Dg Chest 2 View  05/09/2012  *RADIOLOGY REPORT*  Clinical Data: Pain.  Speaking unclearly this morning.  CHEST - 2 VIEW  Comparison: 03/31/2012  Findings: The heart is enlarged.  There are no focal consolidations or pleural effusions.  No edema.  The patient has an inferior vena cava filter.  Surgical clips are identified in the right upper quadrant of the abdomen.  IMPRESSION:  1.  Cardiomegaly. 2.No evidence for acute pulmonary abnormality.  Original Report Authenticated By: Patterson Hammersmith, M.D.   Ct Head Wo Contrast  05/09/2012  *RADIOLOGY REPORT*  Clinical Data: Right hand weakness.  Right-sided facial droop. Headache.  History of hypertension and  prostate cancer.  CT HEAD WITHOUT CONTRAST  Technique:  Contiguous axial images were obtained from the base of the skull through the vertex without contrast.  Comparison: 04/05/2012  Findings: Bone windows demonstrate left maxillary sinus mucous retention cyst or polyp. Clear mastoid air cells.  Soft tissue windows demonstrate no  mass lesion, hemorrhage, hydrocephalus, acute infarct, intra-axial, or extra-axial fluid collection.  IMPRESSION: No acute intracranial abnormality.  Original Report Authenticated By: Consuello Bossier, M.D.   Mri Brain Without Contrast  05/09/2012  *RADIOLOGY REPORT*  Clinical Data:  78 year old male with right upper extremity weakness.  Slurred speech.  Atrial fibrillation, rapid heart rate.  Comparison: Head CT 05/09/2012 and earlier.  MRI HEAD WITHOUT CONTRAST  Technique: Multiplanar, multiecho pulse sequences of the brain and surrounding structures were obtained according to standard protocol without intravenous contrast.  Findings: Tiny acute cortically based areas of restricted diffusion are noted along the left motor strip (series 7 images 18 and 26). Diffusion elsewhere is within normal limits.  No mass effect or hemorrhage.  Little to no associated T2 and FLAIR hyperintensity.  Major intracranial vascular flow voids are preserved, see MRA findings below.  No midline shift, ventriculomegaly, mass effect, evidence of mass lesion, extra-axial collection or acute intracranial hemorrhage. Cervicomedullary junction within normal limits.  Normal for age gray and white matter signal outside of the acute findings described earlier.  There is a 7-10 mm T2 hyperintense T1 hypointense intrasellar mass.  Convex upper margin of the pituitary subsequently.  No suprasellar extension or mass effect.  The no involvement of the cavernous sinuses.  Negative for age visualized cervical spine.  Normal bone marrow signal.  Postoperative changes to the globes. Visualized paranasal sinuses and mastoids  are clear.  Negative scalp soft tissues.  IMPRESSION: 1.  Two tiny acute cortically based infarcts along the left motor strip, left MCA territory.  No mass effect or hemorrhage. 2.  MRA findings are below. 3.  Benign-appearing 9 mm pituitary lesion, favor either Rathke cleft cyst or microadenoma, and very likely this is inconsequential.  MRA HEAD WITHOUT CONTRAST  Technique: Angiographic images of the Circle of Willis were obtained using MRA technique without  intravenous contrast.  Findings: Antegrade flow in the posterior circulation with codominant distal vertebral arteries.  Normal left PICA.  Normal vertebrobasilar junction.  No basilar artery stenosis.  Normal AICA vessels.  Normal SCA and PCA origins.  Posterior communicating arteries are diminutive or absent.  Bilateral PCA branches are within normal limits.  Antegrade flow in both ICA siphons.  No ICA stenosis.  Normal ophthalmic artery origins.  Normal carotid termini, MCA and ACA origins.  Diminutive anterior communicating artery.  Visualized ACA and right MCA branches are within normal limits.  Left MCA origin and M1 segment are within normal limits.  Left MCA bifurcation is patent.  Visualized left MCA branches are within normal limits.  IMPRESSION: Negative intracranial MRA.  Original Report Authenticated By: Harley Hallmark, M.D.   Mr Mra Head/brain Wo Cm  05/09/2012  *RADIOLOGY REPORT*  Clinical Data:  78 year old male with right upper extremity weakness.  Slurred speech.  Atrial fibrillation, rapid heart rate.  Comparison: Head CT 05/09/2012 and earlier.  MRI HEAD WITHOUT CONTRAST  Technique: Multiplanar, multiecho pulse sequences of the brain and surrounding structures were obtained according to standard protocol without intravenous contrast.  Findings: Tiny acute cortically based areas of restricted diffusion are noted along the left motor strip (series 7 images 18 and 26). Diffusion elsewhere is within normal limits.  No mass effect or  hemorrhage.  Little to no associated T2 and FLAIR hyperintensity.  Major intracranial vascular flow voids are preserved, see MRA findings below.  No midline shift, ventriculomegaly, mass effect, evidence of mass lesion, extra-axial collection or acute intracranial hemorrhage. Cervicomedullary junction within normal limits.  Normal for age gray and white matter signal outside of the acute findings described earlier.  There is a 7-10 mm T2 hyperintense T1 hypointense intrasellar mass.  Convex upper margin of the pituitary subsequently.  No suprasellar extension or mass effect.  The no involvement of the cavernous sinuses.  Negative for age visualized cervical spine.  Normal bone marrow signal.  Postoperative changes to the globes. Visualized paranasal sinuses and mastoids are clear.  Negative scalp soft tissues.  IMPRESSION: 1.  Two tiny acute cortically based infarcts along the left motor strip, left MCA territory.  No mass effect or hemorrhage. 2.  MRA findings are below. 3.  Benign-appearing 9 mm pituitary lesion, favor either Rathke cleft cyst or microadenoma, and very likely this is inconsequential.  MRA HEAD WITHOUT CONTRAST  Technique: Angiographic images of the Circle of Willis were obtained using MRA technique without  intravenous contrast.  Findings: Antegrade flow in the posterior circulation with codominant distal vertebral arteries.  Normal left PICA.  Normal vertebrobasilar junction.  No basilar artery stenosis.  Normal AICA vessels.  Normal SCA and PCA origins.  Posterior communicating arteries are diminutive or absent.  Bilateral PCA branches are within normal limits.  Antegrade flow in both ICA siphons.  No ICA stenosis.  Normal ophthalmic artery origins.  Normal carotid termini, MCA and ACA origins.  Diminutive anterior communicating artery.  Visualized ACA and right MCA branches are within normal limits.  Left MCA origin and M1 segment are within normal limits.  Left MCA bifurcation is patent.   Visualized left MCA branches are within normal limits.  IMPRESSION: Negative intracranial MRA.  Original Report Authenticated By: Harley Hallmark, M.D.   Admission HPI: Mr. Biel is a 78 year old male with extensive PMH significant for HTN, CAD, Afib (not on anticoagulation), COPD on 3L O2, DVT, PE s/p IVC filter placement, renal insufficiency, prostate cancer, b/l hip arthroplasty, and s/p cholecystectomy, appendectomy, and partial colon resection with splenectomy presenting with right arm weakness, right sided facial droop, and slurred speech this morning. Mr. Heskett claims when he woke up this morning ~6am, his right arm was completely "limp" and he could not lift it at all. He recently fractured his right wrist early May 2013 s/p fall and recently got his cast taken off 3 weeks ago and is now in a wrist brace which he occasionally takes off. Due to right arm pain from fall, he has limited mobility of his right arm and admits to not using it much, but this was the first time he ever felt it limp and unmovable. His wife and daughter were also in the room who relay that arm weakness was the first thing noticed and resolved in approximately 1 hour, then they later noticed facial droop while he was eating cereal and milk was running down his face, followed by slurred speech which all resolved by the time they got to the ED this morning around 9am. Mr. Gilkison and his family deny any confusion, LOC, or seizure activity and report no prior similar episodes. He denies any headaches, lightheadedness, blurry vision, N/V/D, cough, chest pain, or abdominal pain at this time. He does complain of worsening shortness of breath over the past few months despite being on 3L [place] Oxygen at home, occasional chest tightness, and b/l leg edema.  Of note, Mr. Stocks has been seeing his Cardiologist, Dr. Norman Herrlich in [place] and was told he has AFIB for the past year and a half. He is not on current  anticoagulation  due to hx of DVT-->PE seven years ago, IVC filter placed, and then apparent massive GIB 5-6 years ago. He was recently started on Verapamil by Dr. Dulce Sellar and is not on any aspirin at this time. He claims to have had an echo and cath done but is not aware of the results, I have contacted Dr. Hulen Shouts office to get prior records. His PMD is Dr. Tomasa Blase in [place], [place]. Additionally, Mr. Cowie reports having a fall in June 2013 where he hit the back of his head and had to get stitches and CT head at [facility]. The daughter reports that CT head at that time was negative and then he also fell taking out the trash in May 2013 on his right arm leading to a fracture for which he now wears a brace.  I was able to speak to Dr. Orvan Falconer from [facility] in [place], [place] who was kind enough to confirm some prior medical records for me:  Last Echo 04/07/12: LVEF 40-45%  Cardiology note by Dr. Dulce Sellar on 04/10/12: Patient has Afib, decompensated heart failture, and chronic renal insufficiency, will work with him to optimize treatment and start Ranexa. Due to large GIB in the past, was not anticoagulating and wife and husband reported intolerance to ASA and Warfarin.  Labs from 03/31/12: K+ 3.7, BUN/Cr: 51/1.7, GFR:38, BNP: 3780, Hb/Hct: 13.3/40, WBC:8.9, PLT:194, MCV:104  Hospital Course by problem list: Principal Problem:  --Stroke: Ischemic Stroke--likely embolic in nature secondary to AFIB no anticoagulation in setting of likely worsening CHF. Presented with transient right arm weakness, right sided facial droop, and slurred speech morning of admission 05/09/12. All symptoms had resolved by the time of admission and have not returned throughout this hospital admission.  ABCD2 score of 6 = 8.1% stroke risk. CT head negative for hemorrhage or acute intracranial abnormality. MRI/MRA positive for two tiny acute cortically based infarcts along left MCA territory.  TEE was to be done 05/11/12,  however cancelled by Cardiology since Coumadin has been initiated since 05/10/12 and should be continued regardless of TEE outcome unless there is bleeding or other contraindication in the future, and thus, management will not change. Carotid dopplers were done 05/10/12 with preliminary report negative for any significant ICA stenosis and with antegrade vertebral artery flow.  He was given ASA 325mg  PO initially and eventually changed to ASA 81mg  PO QD until coumadin levels become therapeutic.  Starting INR levels were  0.94 with PT of 12.8.  Mr. Cardoza and his family have been counseled extensive by medical team and pharmacy in regards to Coumadin treatment and importance of followup.  He has been advised to continue taking Aspirin 81mg  PO QD until INR reaches therapeutic dose and discussed with PMD.  Patient's family states they have Aspirin at home and will also purchase more if need be.  I spoke with his PCP--Dr. Tomasa Blase in [place] this afternoon in regards to his case and current presentation.  Dr. Tomasa Blase has agreed to see Mr. Neisler in his office 05/12/12 at 130pm for follow up and will continue to follow up coumadin treatment, anticoagulation, and INR monitoring.  Mr. Heacock blood pressure medications were initially held, including Lasix.  He has been re-started on Coreg lose dose same as home dose day of discharge and is advised to follow up with PCP for further management of BP in setting of acute stroke.  His verapamil and Lasix have been held thus far.  PT/OT has evaluated Mr. Voth today and  he is encouraged to walk with assistance.  He will likely need outpatient PT for his right arm s/p fracture now in wrist brace and walking with supervision due to falls risk and now on Coumadin increasing risk of bleeding.     Active Problems:  HTN (hypertension)--stable, asymptomatic.  On admission 135/65.  All hypertensive medications including home dose of Coreg and Verapamil along with Lasix  held on admission in setting of acute stroke.  Lasix and Verapamil continue to be held upon discharge and will need to be re-initiated with guidance of PCP.  Coreg home dose of 3.125mg  PO BID has been restarted day of admission, and will also need to be discussed with PCP.     Dyspnea--claims to have worsening shortness of breath for a few months, is on home 3L [place] Oxygen, has been stable and saturating well during this admission with continuation of [place] 3L O2.  Dyspnea seems likely secondary to worsening CHF at this time.  Stable at rest, increased shortness of breath claimed upon movement.  Will need to be followed closely by PCP and cardiologist as well.  PE is included in the differential due to previous hx of DVT and PE, tachycardia on admission, age, limited mobility of RUE s/p fracture with geneva score of 9-11 (due to previous hx) showing intermediate probability (28%). However, PE could be less likely due to existing IVC filter, worsening shortness of breath for several months and not acute and also appears more attributed to CHF with no outright chest pain complaints but did complain of some claim chest tightness since then resolved. Additionally, Mr. Files has chronic renal insufficiency which would be difficult to administer contrast dye for CT angio chest. COPD exacerbation may also account for SOB as Mr. Wich is on 3L o2 at home, however, lung sounds are clear b/l on exam, and cxr shows no evidence of acute pulmonary abnormality. Worsening CHF is most likely the contributing factor for SOB with EF of 40-45%, cardiomegaly, complaints of recent leg edema, and elevated BNP.  Discussed with Mr. Gaspard and family in regards to possibility of getting CT angio chest for r/o PE on admission but risk of contrast dye worsening renal function in setting of chronic renal insufficiency; family decided to hold off on getting CT chest with dye given the risks and not done during this admission.        Hx of Afib not on anticoagulation--presented on admission with Afib, eventually started on Coumadin after careful discussion with family, and family decided to proceed with Coumadin treatment.  Therapy will need to be followed closely by PCP with INR levels and counseling.  Has a hx of large GI bleed and has been off anticoagulation since then.  Advised to keep taking Aspirin daily until therapeutic INR.  Given daily dose of Coumadin on day of discharge, will follow up with PCP day after discharge at 130pm.     Tachycardia--present in the setting of Afib.  Increase heart rate upon movement, but quickly stabilizes and Mr. Schnorr remains asymptomatic.  Was tried on low dose Digoxin 05/10/12 but made no major impact and was discontinued day of discharge.  Will need to discuss rate control with PCP and Cardiologist as soon as possible.   CHF (congestive heart failure)--EF reported to be 40-45% from previous records from July 2012 obtained by calling on call physician for [facility]--Dr. Orvan Falconer.  Mr. Bahl follows up with Dr. Dulce Sellar as his cardiologist and is recommended to follow up  with his as soon as possible.  Efforts were made to try to obtain previous records or speak to someone from his office during this admission, but unsuccessful. CHF appears to be worsening with worsening shortness of breath, b/l leg edema, and previous BNP of 3780 (03/31/12).  proBNP during this admission 2379.  Cardiac enzymes x1 were negative.  TEE canceled but will need to possibly get repeat echocardiogram with Dr. Dulce Sellar in the near future.  Lasix held for now in setting of acute stroke, will need to follow recommendations of Dr. Tomasa Blase and [facility] for restarting.      Chronic renal insufficiency--BUN/Cr=32/1.6 on admission, previous renal function from 03/31/12: BUN/Cr=51/1.7.  During admission, BUN/Cr down to 29/1.50. Currently stable but will need to follow up with PCP as soon as possible.     Hx of  Pulmonary embolism--IVC filter in place.  Was on Coumadin in the past but had massive GI bleed due to drug interaction with sulfa drugs as per patient and family.  Has been off anticoagulation for years, restarted Coumadin this admission.  Will need careful follow up with PCP as soon as possible.     Hyperlipidemia--stable, lipid panel checked this admission revealed LDL 53 and HDL 45.  On home dose of Simvastatin 80mg  daily.  Mr. Honor was apparently taking Ranexa at some point, but should be monitored closely with drug-drug interaction with Statins.  Advised to hold off on Ranexa at this time until following up with PCP and cardiologist and re-assessment of Simvastatin or Ranexa.      Hx of Prostate cancer--stable.  S/p surgery 2003.  Follows up with oncologist as outpatient, reports return of possible malignant cells, and advised to follow up as soon as possible.     S/P bilateral hip replacements--Left hip 2009 and right hip 2010 with hx of degenerative joint disease as per Mr. Whorley and family.  Stable at this time, supervision while walking as per PT, falls risk and especially while on Coumadin.     Hx of cataract surgery--vision stable.  Advised to follow up with ophthalmologist as outpatient as needed     Hypothyroidism--stable, on synthroid home dose QD, advised to continue, and follow up with PCP for management and TSH levels.     Discharge Vitals:  BP 108/59  Pulse 99  Temp 97.8 F (36.6 C) (Oral)  Resp 20  Ht 5\' 7"  (1.702 m)  Wt 179 lb 10.8 oz (81.5 kg)  BMI 28.14 kg/m2  SpO2 100%  Discharge Labs:  Results for orders placed during the hospital encounter of 05/09/12 (from the past 24 hour(s))  PROTIME-INR     Status: Normal   Collection Time   05/11/12  5:12 AM      Component Value Range   Prothrombin Time 12.8  11.6 - 15.2 seconds   INR 0.94  0.00 - 1.49   Signed: Darden Palmer 05/11/2012, 3:04 PM   Time Spent on Discharge: >30 minutes

## 2012-05-11 NOTE — Progress Notes (Signed)
Internal Medicine Teaching Service Attending Note Date: 05/11/2012  Patient name: Manuel [name]  Medical record number: [id number]  Date of birth: Jun 05, 1922    This patient has been seen and discussed with the house staff. Please see their note for complete details. I concur with their findings with the following additions/corrections: [name] was seen on AM rounds. His wife and daughter were present.   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 He is sch for TEE today. He was started on warfarin. He and his family have no concerns about using warfarin. His carotids dopplers pre-lim report showed no stenosis. PT eval is P but pt has been walking independently anyway and all deficits have resolved.  The Dig, as predicted, didn't help much with his tachycardia so will be stopped. We will add his BB for rate control since he is now 72 hours out from CVA. We will cont to hold his CCB and lasix. He does still have sig peripheral edema and we have encouraged him to keep his legs elevated and use his stockings.  I have encouraged him to discuss bisphosphonates with his PCP as pt had fragility fracture and is on Lupron.   Hope for D/C tomorrow.   BUTCHER,ELIZABETH 05/11/2012, 10:45 AM

## 2012-05-11 NOTE — Progress Notes (Signed)
Physical Therapy Evaluation Patient Details Name: Manuel [name] MRN: [id number] DOB: [birth year] Today's Date: 05/11/2012 Time: 8469-6295 PT Time Calculation (min): 18 min  PT Assessment / Plan / Recommendation Clinical Impression  Pt is 78 yo male with left CVA who has had resolution of symptoms and is back to baseline functionally.  Pt reports that he feels that he has less energy overall since he broke his arm in May.  Pt was given education on appropriate mobilization at home in re: to duration and frequency.  Pt does not have further acute or f/u PT needs at this time.    PT Assessment  Patent does not need any further PT services    Follow Up Recommendations  No PT follow up    Barriers to Discharge        Equipment Recommendations  None recommended by PT    Recommendations for Other Services     Frequency      Precautions / Restrictions Precautions Precautions: Fall Precaution Comments: pt has had 2 falls, 1 over a fence that he tripped on and one when standing in a chair Required Braces or Orthoses: Other Brace/Splint Other Brace/Splint: right wrist splint Restrictions Weight Bearing Restrictions: Yes RLE Weight Bearing:  (not specified)   Pertinent Vitals/Pain 0/10 pain, VSS      Mobility  Bed Mobility Bed Mobility: Not assessed (pt mobilizing out of of bed independently) Transfers Transfers: Sit to Stand;Stand to Sit Sit to Stand: 6: Modified independent (Device/Increase time);From chair/3-in-1;With upper extremity assist Stand to Sit: 6: Modified independent (Device/Increase time);With upper extremity assist;To chair/3-in-1 Ambulation/Gait Ambulation/Gait Assistance: 6: Modified independent (Device/Increase time) Ambulation Distance (Feet): 200 Feet Assistive device: None Ambulation/Gait Assistance Details: no assistance needed with gait, no LOB Gait Pattern: Within Functional Limits Gait velocity: WFL Stairs: No Wheelchair Mobility Wheelchair  Mobility: No Modified Rankin (Stroke Patients Only) Pre-Morbid Rankin Score: No symptoms Modified Rankin: No symptoms    Exercises     PT Diagnosis:    PT Problem List:   PT Treatment Interventions:     PT Goals    Visit Information  Last PT Received On: 05/11/12 Assistance Needed: +1    Subjective Data  Subjective: I feel back to my normal self Patient Stated Goal: be able to mow yard   Prior Functioning  Home Living Lives With: Spouse Available Help at Discharge: Available 24 hours/day Type of Home: House Home Access: Stairs to enter Secretary/administrator of Steps: 2 Entrance Stairs-Rails: None Home Layout: One level Home Adaptive Equipment: None Prior Function Level of Independence: Independent Able to Take Stairs?: Yes Driving: Yes Vocation: Retired Musician: No difficulties    Cognition  Overall Cognitive Status: Appears within functional limits for tasks assessed/performed Arousal/Alertness: Awake/alert Orientation Level: Appears intact for tasks assessed Behavior During Session: [facility] for tasks performed    Extremity/Trunk Assessment Right Upper Extremity Assessment RUE ROM/Strength/Tone: Deficits RUE ROM/Strength/Tone Deficits: pt's right wrist still in a splint from falling in May, shoulder tested, flexion 4+/5, extension 4+/5 RUE Sensation: WFL - Light Touch RUE Coordination: WFL - gross motor Left Upper Extremity Assessment LUE ROM/Strength/Tone: WFL for tasks assessed LUE Sensation: WFL - Light Touch LUE Coordination: WFL - gross motor Right Lower Extremity Assessment RLE ROM/Strength/Tone: WFL for tasks assessed RLE Sensation: WFL - Light Touch RLE Coordination: WFL - gross motor Left Lower Extremity Assessment LLE ROM/Strength/Tone: WFL for tasks assessed LLE Sensation: WFL - Light Touch LLE Coordination: WFL - gross motor Trunk Assessment Trunk Assessment: Kyphotic  Balance Balance Balance Assessed: Yes Dynamic  Standing Balance Dynamic Standing - Balance Support: No upper extremity supported;During functional activity Dynamic Standing - Level of Assistance: 6: Modified independent (Device/Increase time)  End of Session PT - End of Session Equipment Utilized During Treatment: Gait belt;Oxygen Activity Tolerance: Patient tolerated treatment well Patient left: in chair;with call bell/phone within reach;with family/visitor present Nurse Communication: Mobility status  GP Functional Assessment Tool Used: clinical judgement Functional Limitation: Mobility: Walking and moving around Mobility: Walking and Moving Around Current Status (N8295): 0 percent impaired, limited or restricted Mobility: Walking and Moving Around Goal Status (A2130): 0 percent impaired, limited or restricted Mobility: Walking and Moving Around Discharge Status [phone]): 0 percent impaired, limited or restricted   Lyanne Co, PT  Acute Rehab Services  [phone]  Lyanne Co 05/11/2012, 3:43 PM

## 2012-05-11 NOTE — Progress Notes (Signed)
Discussed with Dr Rogelia Boga; patient in atrial fibrillation and now with CVA. Coumadin has been initiated and should be continued regardless of TEE findings unless there is bleeding or other contraindication in the future. Given that TEE will not change management (coumadin indicated whether thrombus identified or not), procedure cancelled. Olga Millers

## 2012-05-14 ENCOUNTER — Ambulatory Visit (INDEPENDENT_AMBULATORY_CARE_PROVIDER_SITE_OTHER): Payer: Medicare Other | Admitting: Cardiology

## 2012-05-14 ENCOUNTER — Encounter (HOSPITAL_COMMUNITY): Payer: Self-pay | Admitting: Cardiology

## 2012-05-14 VITALS — BP 140/58 | HR 99 | Ht 67.0 in | Wt 185.0 lb

## 2012-05-14 DIAGNOSIS — I509 Heart failure, unspecified: Secondary | ICD-10-CM

## 2012-05-14 DIAGNOSIS — I6350 Cerebral infarction due to unspecified occlusion or stenosis of unspecified cerebral artery: Secondary | ICD-10-CM

## 2012-05-14 DIAGNOSIS — I639 Cerebral infarction, unspecified: Secondary | ICD-10-CM

## 2012-05-14 DIAGNOSIS — I4891 Unspecified atrial fibrillation: Secondary | ICD-10-CM

## 2012-05-14 DIAGNOSIS — Z7901 Long term (current) use of anticoagulants: Secondary | ICD-10-CM

## 2012-05-14 MED ORDER — CARVEDILOL 6.25 MG PO TABS: 6.2500 mg | ORAL_TABLET | Freq: Two times a day (BID) | ORAL | Status: DC

## 2012-05-14 NOTE — Progress Notes (Signed)
[name] Date of Birth: [birth year] Medical Record #[id number]  History of Present Illness: Mr. Trompeter is seen at the request of Dr. Tomasa Blase for evaluation of congestive heart failure and atrial fibrillation. He is a very pleasant 78 year old white male who has a history of permanent atrial fibrillation. This was diagnosed 1-1/2 years ago during a preoperative exam. He was not anticoagulated at that time because of a concern of previous gastrointestinal bleeding on Coumadin in the past. He was admitted this past week to [facility] with an acute CVA. This resulted in right arm weakness, dysarthria, and drawing of his right face. Patient was anticoagulated. Carotid Dopplers demonstrated no obstructive disease. He had a previous echocardiogram as an outpatient that showed an ejection fraction of 40-45%. He also had a nuclear stress test which showed no ischemia and an ejection fraction of 39%. In reviewing his history his prior gastrointestinal bleed on Coumadin occurred when he was placed on sulfa and had a markedly elevated INR. It was felt that he would benefit from Coumadin at this time. During his hospital stay he had a lot of lability in his pulse rate. Given his congestive heart failure verapamil was discontinued. He had some renal insufficiency so his Lasix was held. He was started on low-dose of carvedilol. Since discharge she has been gaining a pound a day. He has had increasing lower remedy edema. He is wearing support stockings. He was started back on verapamil this past Tuesday because of an elevated heart rate. He denies any bleeding problems. In general he feels that he is back close to his baseline. He still feels quite tired and fatigues easily. He has been on chronic home oxygen therapy as a result of a prior pulmonary embolus. During his hospital stay his creatinine ranged from 1.5-1.6. Repeat blood work this week showed a creatinine of 1.8.  Current Outpatient  Prescriptions on File Prior to Visit  Medication Sig Dispense Refill  . albuterol (PROVENTIL) (2.5 MG/3ML) 0.083% nebulizer solution Take 2.5 mg by nebulization 3 (three) times daily.      . cyanocobalamin (,VITAMIN B-12,) 1000 MCG/ML injection Inject 1,000 mcg into the muscle every 30 (thirty) days.      . febuxostat (ULORIC) 40 MG tablet Take 40 mg by mouth every evening.       . fish oil-omega-3 fatty acids 1000 MG capsule Take 1 g by mouth daily.      . lansoprazole (PREVACID) 30 MG capsule Take 30 mg by mouth every evening.      Marland Kitchen levothyroxine (SYNTHROID, LEVOTHROID) 75 MCG tablet Take 75 mcg by mouth daily.      Marland Kitchen loratadine (CLARITIN) 10 MG tablet Take 10 mg by mouth daily.      . Multiple Vitamin (MULTIVITAMIN WITH MINERALS) TABS Take 1 tablet by mouth every evening.      Bertram Gala Glycol-Propyl Glycol (SYSTANE) 0.4-0.3 % SOLN Apply 1 drop to eye daily as needed. For dry eyes      . simvastatin (ZOCOR) 80 MG tablet Take 80 mg by mouth every evening.      . sodium chloride (OCEAN) 0.65 % nasal spray Place 1 spray into the nose as needed. For congestion      . warfarin (COUMADIN) 6 MG tablet as directed.      . Wheat Dextrin (BENEFIBER PO) Take 1 tablet by mouth daily.      Marland Kitchen DISCONTD: carvedilol (COREG) 3.125 MG tablet Take 3.125 mg by mouth 2 (two) times daily  with a meal.        Allergies  Allergen Reactions  . Morphine And Related Nausea And Vomiting    Past Medical History  Diagnosis Date  . Pulmonary embolism 2003  . COPD (chronic obstructive pulmonary disease)     on home O2  . Hypertension   . Atrial fibrillation   . Hyperlipidemia   . Prostate cancer   . Colon polyps   . Ulnar fracture   . CVA (cerebral infarction)   . CKD (chronic kidney disease) stage 3, GFR 30-59 ml/min   . Hypothyroidism     Past Surgical History  Procedure Date  . Tee without cardioversion 05/11/2012    Procedure: TRANSESOPHAGEAL ECHOCARDIOGRAM (TEE);  Surgeon: Lewayne Bunting, MD;   Location: [facility] ENDOSCOPY;  Service: Cardiovascular;  Laterality: N/A;  Rm 4N21  . Prostatectomy   . Cholecystectomy   . Appendectomy   . Partial colectomy   . Cataract extraction     bilateral  . Total hip arthroplasty     bilateral  . Splenectomy   . Ivc filter     History  Smoking status  . Former Smoker  Smokeless tobacco  . Never Used    History  Alcohol Use No    History reviewed. No pertinent family history.  Review of Systems: The review of systems is positive for lower extremity swelling.  He had a fracture of his right wrist in May. He just got the splint taken off. He reports his INR is not yet therapeutic. All other systems were reviewed and are negative.  Physical Exam: BP 140/58  Pulse 99  Ht 5\' 7"  (1.702 m)  Wt 185 lb (83.915 kg)  BMI 28.97 kg/m2  SpO2 93% He is a pleasant elderly white male in no acute distress. He is wearing oxygen. He is normocephalic, atraumatic. He is balding. His pupils are equal round and reactive. Sclera are clear. Oropharynx is clear. Neck is supple without jugular venous distention, thyromegaly, adenopathy, or bruits. Lungs are clear. Cardiac exam reveals an irregular rate and rhythm without gallop, murmur, or click. S1 and S2 are normal. Abdomen is obese, soft, nontender. He has no hepatosplenomegaly or masses. Extremities reveal that he is wearing support stockings. He has 1-2+ bilateral edema. Skin is warm and dry. He does have some superficial bruising on his arms. He is alert oriented x3. Cranial nerves II through XII are intact. His speech is normal.  LABORATORY DATA: Dated 05/13/2012 hemoglobin was 13.3. BUN 38, creatinine 1.81. Liver function studies are all normal.  Assessment / Plan: 1. Atrial fibrillation. His rate is still not optimal. I would not recommend verapamil in the setting of his LV dysfunction given its negative inotropic effects. I would recommend pushing his carvedilol to higher doses as long as his blood  pressure tolerates. We will increase it to 6.25 mg twice daily today. If need be this could be increased further. If he continues to have difficulty with rate control despite optimizing his beta blocker therapy then I would consider diltiazem since this has less of a negative inotropic affect than verapamil. Digoxin would also be an option but we would have to be very careful with it  given his renal insufficiency and advanced age. I will have him followup with [place] in 2 weeks.  2. Congestive heart failure acute on chronic systolic dysfunction. He has had increased edema and weight. I think he will need some Lasix therapy although his creatinine is mildly elevated compared to baseline.  I'll start him on 40 mg daily. I stressed the importance of sodium restriction.  3. Anticoagulation. I agree with starting Coumadin. INRs will need to be followed closely. We'll stop aspirin once it is therapeutic  4. History of pulmonary emboli. Status post IVC filter. On chronic home oxygen therapy.  5. CVA embolic  6. Chronic kidney disease stage III.

## 2012-05-14 NOTE — Patient Instructions (Signed)
Stop taking Verapamil   Resume Lasix at 40 mg daily  Increase Coreg to 6.25 mg twice a day.  I will have you return in 2 weeks to see Lawson Fiscal

## 2012-05-27 ENCOUNTER — Encounter: Payer: Self-pay | Admitting: Nurse Practitioner

## 2012-05-27 ENCOUNTER — Telehealth: Payer: Self-pay | Admitting: *Deleted

## 2012-05-27 ENCOUNTER — Ambulatory Visit (INDEPENDENT_AMBULATORY_CARE_PROVIDER_SITE_OTHER): Payer: Medicare Other | Admitting: Nurse Practitioner

## 2012-05-27 VITALS — BP 132/64 | HR 77 | Ht 67.0 in | Wt 182.0 lb

## 2012-05-27 DIAGNOSIS — R0989 Other specified symptoms and signs involving the circulatory and respiratory systems: Secondary | ICD-10-CM

## 2012-05-27 DIAGNOSIS — R0609 Other forms of dyspnea: Secondary | ICD-10-CM

## 2012-05-27 DIAGNOSIS — I5022 Chronic systolic (congestive) heart failure: Secondary | ICD-10-CM

## 2012-05-27 DIAGNOSIS — R0600 Dyspnea, unspecified: Secondary | ICD-10-CM

## 2012-05-27 LAB — CBC WITH DIFFERENTIAL/PLATELET
Basophils Absolute: 0.1 10*3/uL (ref 0.0–0.1)
Basophils Relative: 0.6 % (ref 0.0–3.0)
Eosinophils Absolute: 0.1 10*3/uL (ref 0.0–0.7)
Eosinophils Relative: 1 % (ref 0.0–5.0)
HCT: 40.6 % (ref 39.0–52.0)
Hemoglobin: 13.1 g/dL (ref 13.0–17.0)
Lymphocytes Relative: 25.7 % (ref 12.0–46.0)
Lymphs Abs: 2.5 10*3/uL (ref 0.7–4.0)
MCHC: 32.2 g/dL (ref 30.0–36.0)
MCV: 106.1 fl — ABNORMAL HIGH (ref 78.0–100.0)
Monocytes Absolute: 0.9 10*3/uL (ref 0.1–1.0)
Monocytes Relative: 9.6 % (ref 3.0–12.0)
Neutro Abs: 6.1 10*3/uL (ref 1.4–7.7)
Neutrophils Relative %: 63.1 % (ref 43.0–77.0)
Platelets: 161 10*3/uL (ref 150.0–400.0)
RBC: 3.83 Mil/uL — ABNORMAL LOW (ref 4.22–5.81)
RDW: 14.4 % (ref 11.5–14.6)
WBC: 9.7 10*3/uL (ref 4.5–10.5)

## 2012-05-27 LAB — BASIC METABOLIC PANEL
BUN: 28 mg/dL — ABNORMAL HIGH (ref 6–23)
CO2: 31 mEq/L (ref 19–32)
Calcium: 8.9 mg/dL (ref 8.4–10.5)
Chloride: 104 mEq/L (ref 96–112)
Creatinine, Ser: 1.7 mg/dL — ABNORMAL HIGH (ref 0.4–1.5)
GFR: 41.46 mL/min — ABNORMAL LOW (ref >60.00)
Glucose, Bld: 81 mg/dL (ref 70–99)
Potassium: 3.7 mEq/L (ref 3.5–5.1)
Sodium: 141 mEq/L (ref 135–145)

## 2012-05-27 LAB — BRAIN NATRIURETIC PEPTIDE: Pro B Natriuretic peptide (BNP): 569 pg/mL — ABNORMAL HIGH (ref 0.0–100.0)

## 2012-05-27 MED ORDER — FUROSEMIDE 80 MG PO TABS: ORAL_TABLET | ORAL | Status: DC

## 2012-05-27 NOTE — Telephone Encounter (Signed)
Message copied by Awilda Bill on Wed May 27, 2012  4:24 PM ------      Message from: Rosalio Macadamia      Created: Wed May 27, 2012  2:46 PM       Ok to report. Labs are satisfactory. His fluid level test has improved. Will proceed with our plan per OV today.

## 2012-05-27 NOTE — Telephone Encounter (Signed)
Discussed patient labs with pts spouse.  Pt hard of hearing.  Patient aware of results.  Vista Mink, CMA

## 2012-05-27 NOTE — Progress Notes (Signed)
[name] Date of Birth: 10-17-1921 Medical Record #[id number]  History of Present Illness: Manuel [name] is seen back today for a 2 week check. He is seen for Dr. Swazil[place]. He is an 78 year old male with multiple medical issues. These include CHF, chronic atrial fib, past GI bleed, recent CVA, remote PE, chronic oxygen therapy and advanced age. He has had an echo and stress test done at [place] earlier this summer after a broken arm. His EF is 40 to 45% per echo. Nuclear with no ischemia and EF of 39%. Now back on coumadin given the recent CVA. This is monitored by his PCP. INR was 1.8 yesterday, but was up to 4.0 last week. He had his Coreg increased at his last visit and had his lasix restarted. He has chronic kidney disease. He does have a vena cava filter in place.   He comes in today. He is here with his wife and daughter. Still doing rather poorly. Remains short of breath. Not any worse but no better since his last visit. No chest pain. He has already increased his Lasix to 80 mg with some improvement in his weight and swelling. Still using a considerable amount of salt. They eat out most meals and this includes sausage and bacon. They are not going to stop. He remains on his oxygen. Wearing his support stockings. Heart rate remains labile and ranges 50 to 150 per his report.   Current Outpatient Prescriptions on File Prior to Visit  Medication Sig Dispense Refill  . albuterol (PROVENTIL) (2.5 MG/3ML) 0.083% nebulizer solution Take 2.5 mg by nebulization 3 (three) times daily.      . carvedilol (COREG) 6.25 MG tablet Take 1 tablet (6.25 mg total) by mouth 2 (two) times daily.  180 tablet  3  . cyanocobalamin (,VITAMIN B-12,) 1000 MCG/ML injection Inject 1,000 mcg into the muscle every 30 (thirty) days.      . febuxostat (ULORIC) 40 MG tablet Take 40 mg by mouth every evening.       . fish oil-omega-3 fatty acids 1000 MG capsule Take 1 g by mouth daily.      . lansoprazole (PREVACID) 30 MG  capsule Take 30 mg by mouth every evening.      Marland Kitchen levothyroxine (SYNTHROID, LEVOTHROID) 75 MCG tablet Take 75 mcg by mouth daily.      Marland Kitchen loratadine (CLARITIN) 10 MG tablet Take 10 mg by mouth daily.      . Multiple Vitamin (MULTIVITAMIN WITH MINERALS) TABS Take 1 tablet by mouth every evening.      . NON FORMULARY Oxygen  ---- 3 Liters 24 hours continuous      . Polyethyl Glycol-Propyl Glycol (SYSTANE) 0.4-0.3 % SOLN Apply 1 drop to eye daily as needed. For dry eyes      . simvastatin (ZOCOR) 80 MG tablet Take 80 mg by mouth every evening.      . sodium chloride (OCEAN) 0.65 % nasal spray Place 1 spray into the nose as needed. For congestion      . warfarin (COUMADIN) 6 MG tablet as directed.      . Wheat Dextrin (BENEFIBER PO) Take 1 tablet by mouth daily.        Allergies  Allergen Reactions  . Morphine And Related Nausea And Vomiting    Past Medical History  Diagnosis Date  . Pulmonary embolism 2003  . COPD (chronic obstructive pulmonary disease)     on home O2  . Hypertension   . Atrial  fibrillation   . Hyperlipidemia   . Prostate cancer   . Colon polyps   . Ulnar fracture   . CVA (cerebral infarction)   . CKD (chronic kidney disease) stage 3, GFR 30-59 ml/min   . Hypothyroidism     Past Surgical History  Procedure Date  . Tee without cardioversion 05/11/2012    Procedure: TRANSESOPHAGEAL ECHOCARDIOGRAM (TEE);  Surgeon: Lewayne Bunting, MD;  Location: [facility] ENDOSCOPY;  Service: Cardiovascular;  Laterality: N/A;  Rm 4N21  . Prostatectomy   . Cholecystectomy   . Appendectomy   . Partial colectomy   . Cataract extraction     bilateral  . Total hip arthroplasty     bilateral  . Splenectomy   . Ivc filter     History  Smoking status  . Former Smoker  Smokeless tobacco  . Never Used    History  Alcohol Use No    History reviewed. No pertinent family history.  Review of Systems: The review of systems is positive for generalized weakness and DOE.  All other  systems were reviewed and are negative.  Physical Exam: BP 132/64  Pulse 77  Ht 5\' 7"  (1.702 m)  Wt 182 lb (82.555 kg)  BMI 28.51 kg/m2  SpO2 94% Patient is very pleasant and in no acute distress. He does look chronically ill and frail. Skin is warm and dry. Color is normal.  HEENT is unremarkable but lips are a little blue. Normocephalic/atraumatic. PERRL. Sclera are nonicteric. Neck is supple. No masses. No JVD. Lungs are fairly clear. On oxygen. Cardiac exam shows an irregular rhythm. His rate is in the 80's by me. Abdomen is soft. Extremities are with just trace edema on the left. He has support stockings in place. Gait and ROM are intact. No gross neurologic deficits noted.   LABORATORY DATA: PENDING FOR TODAY.  Lab Results  Component Value Date   INR 0.94 05/11/2012   INR 0.94 05/10/2012   INR 1.4 01/12/2009     Chemistry      Component Value Date/Time   NA 144 05/10/2012 0626   K 3.7 05/10/2012 0626   CL 108 05/10/2012 0626   CO2 28 05/10/2012 0626   BUN 29* 05/10/2012 0626   CREATININE 1.50* 05/10/2012 0626      Component Value Date/Time   CALCIUM 9.2 05/10/2012 0626   ALKPHOS 63 05/09/2012 1102   AST 27 05/09/2012 1102   ALT 29 05/09/2012 1102   BILITOT 0.3 05/09/2012 1102     Lab Results  Component Value Date   WBC 8.3 05/10/2012   HGB 12.8* 05/10/2012   HCT 37.7* 05/10/2012   MCV 101.9* 05/10/2012   PLT 164 05/10/2012    Assessment / Plan: 1. Systolic heart failure - I have increased his Lasix further for his dyspnea to 80 mg in the am and 40 mg in the pm. We will recheck his labs today. Salt use is an issue and I think they will continue to eat out. Will try to manage conservatively and run him "dry". May try to increase his Coreg further on return visit but would like to give him a little longer to adjust to it. I will see him back in about 2 weeks.   2. Chronic atrial fib - Heart rate in the 80's today. Still with reported lability at home. No excessive bradycardia  reported. Will try to increase his Coreg on return.   3. Chronic coumadin - INR is being checked by his PCP. No  signs of bleeding reported. Still with some lability however.   4. Advanced age - Will try to manage him conservatively. His overall prognosis looks poor to me.   5. Recent CVA - back on anticoagulation.  6. History of PE - he has a filter in place. On chronic oxygen.   Patient and family are agreeable to this plan and will call if any problems develop in the interim.

## 2012-05-27 NOTE — Patient Instructions (Addendum)
Try to limit your salt use  Increase your Lasix to 80 mg in the am and 40 mg in the pm  Stay on your other medicines  We will see you back in about 2 1/2 weeks. Will try to increase your Coreg at that visit.  Call the [facility] office at [phone] if you have any questions, problems or concerns.

## 2012-06-15 ENCOUNTER — Ambulatory Visit: Payer: Medicare Other | Admitting: Nurse Practitioner

## 2012-06-16 ENCOUNTER — Telehealth: Payer: Self-pay | Admitting: *Deleted

## 2012-06-16 ENCOUNTER — Ambulatory Visit (INDEPENDENT_AMBULATORY_CARE_PROVIDER_SITE_OTHER): Payer: Medicare Other | Admitting: Nurse Practitioner

## 2012-06-16 ENCOUNTER — Encounter: Payer: Self-pay | Admitting: Nurse Practitioner

## 2012-06-16 VITALS — BP 126/62 | HR 129 | Ht 67.0 in | Wt 178.0 lb

## 2012-06-16 DIAGNOSIS — I4891 Unspecified atrial fibrillation: Secondary | ICD-10-CM

## 2012-06-16 DIAGNOSIS — R0609 Other forms of dyspnea: Secondary | ICD-10-CM

## 2012-06-16 DIAGNOSIS — R0600 Dyspnea, unspecified: Secondary | ICD-10-CM

## 2012-06-16 DIAGNOSIS — I5022 Chronic systolic (congestive) heart failure: Secondary | ICD-10-CM

## 2012-06-16 LAB — BASIC METABOLIC PANEL
BUN: 37 mg/dL — ABNORMAL HIGH (ref 6–23)
CO2: 35 mEq/L — ABNORMAL HIGH (ref 19–32)
Calcium: 9.1 mg/dL (ref 8.4–10.5)
Chloride: 101 mEq/L (ref 96–112)
Creatinine, Ser: 1.8 mg/dL — ABNORMAL HIGH (ref 0.4–1.5)
GFR: 39.02 mL/min — ABNORMAL LOW (ref >60.00)
Glucose, Bld: 77 mg/dL (ref 70–99)
Potassium: 3.5 mEq/L (ref 3.5–5.1)
Sodium: 149 mEq/L — ABNORMAL HIGH (ref 135–145)

## 2012-06-16 LAB — CBC WITH DIFFERENTIAL/PLATELET
Basophils Absolute: 0.1 10*3/uL (ref 0.0–0.1)
Basophils Relative: 0.7 % (ref 0.0–3.0)
Eosinophils Absolute: 0.1 10*3/uL (ref 0.0–0.7)
Eosinophils Relative: 0.8 % (ref 0.0–5.0)
HCT: 42.6 % (ref 39.0–52.0)
Hemoglobin: 13.8 g/dL (ref 13.0–17.0)
Lymphocytes Relative: 22.3 % (ref 12.0–46.0)
Lymphs Abs: 2.2 10*3/uL (ref 0.7–4.0)
MCHC: 32.4 g/dL (ref 30.0–36.0)
MCV: 106.9 fl — ABNORMAL HIGH (ref 78.0–100.0)
Monocytes Absolute: 0.8 10*3/uL (ref 0.1–1.0)
Monocytes Relative: 8.1 % (ref 3.0–12.0)
Neutro Abs: 6.6 10*3/uL (ref 1.4–7.7)
Neutrophils Relative %: 68.1 % (ref 43.0–77.0)
Platelets: 179 10*3/uL (ref 150.0–400.0)
RBC: 3.99 Mil/uL — ABNORMAL LOW (ref 4.22–5.81)
RDW: 13.7 % (ref 11.5–14.6)
WBC: 9.8 10*3/uL (ref 4.5–10.5)

## 2012-06-16 LAB — BRAIN NATRIURETIC PEPTIDE: Pro B Natriuretic peptide (BNP): 506 pg/mL — ABNORMAL HIGH (ref 0.0–100.0)

## 2012-06-16 MED ORDER — DIGOXIN 125 MCG PO TABS: ORAL_TABLET | ORAL | Status: DC

## 2012-06-16 NOTE — Telephone Encounter (Signed)
Message copied by Awilda Bill on Tue Jun 16, 2012  4:44 PM ------      Message from: Rosalio Macadamia      Created: Tue Jun 16, 2012  4:16 PM       Please report. Kidneys a little more drier. Fluid level test still looks better. His swelling has improved. May cut the Lasix back to just 80 mg a day. Try to restrict salt. Recheck BMET on his return visit.

## 2012-06-16 NOTE — Patient Instructions (Signed)
We are going to check labs today  We are adding Lanoxin 0.125 mg - take one today and tomorrow and then just 1/2 tablet each day  Stay on your other medicines  We will get you an appointment to see pulmonary  Dr. Swaziland will see you in about 2 weeks.  Call the [facility] office at [phone] if you have any questions, problems or concerns.

## 2012-06-16 NOTE — Progress Notes (Signed)
Manuel [name] Date of Birth: May 19, 1922 Medical Record #[id number]  History of Present Illness: Manuel [name] is seen back today for a 3 week check. He is seen for Dr. Swaziland. He has multiple medical issues which include CHF, COPD, chronic atrial fib, past GI bleed, recent CVA, remote PE with a vena cava filter in place, chronic oxygen therapy, CKD  and advanced age. He has had an echo and stress test done at [facility] earlier this summer after a broken arm. EF is 40 to 45% per the echo. Nuclear with no ischemia and EF was 39%. Now back on coumadin given the recent CVA which is monitored by his PCP. He has had his Coreg increased several weeks ago and his lasix was increased at his last visit. Salt use is an issue. They eat out most meals.   He comes in today. He is here with his wife and daughter. He notes that his swelling has improved. Weight is down about 4 pounds. He says he is trying to use less salt. Even though his edema has improved, his breathing has not. He remains short of breath. Still on his oxygen. Has an O2 sat monitor that he reports shows good readings with his oxygen. Heart rate remains variable. Was as low as the 40 to 50s and as high as 150. More recently he has seen 80 to 90's. He is in chronic atrial fib. No cough. Remains fatigued. Has seen pulmonary in the remote past. Sounds like he may have seen Dr. Marcos Eke. Family wishes to be referred back.   Current Outpatient Prescriptions on File Prior to Visit  Medication Sig Dispense Refill  . albuterol (PROVENTIL) (2.5 MG/3ML) 0.083% nebulizer solution Take 2.5 mg by nebulization 3 (three) times daily.      . carvedilol (COREG) 6.25 MG tablet Take 1 tablet (6.25 mg total) by mouth 2 (two) times daily.  180 tablet  3  . cyanocobalamin (,VITAMIN B-12,) 1000 MCG/ML injection Inject 1,000 mcg into the muscle every 30 (thirty) days.      . febuxostat (ULORIC) 40 MG tablet Take 40 mg by mouth every evening.       . fish oil-omega-3 fatty  acids 1000 MG capsule Take 1 g by mouth daily.      . furosemide (LASIX) 80 MG tablet Take 80 mg in the am and 40 mg in the early afternoon  135 tablet  3  . lansoprazole (PREVACID) 30 MG capsule Take 30 mg by mouth every evening.      Marland Kitchen levothyroxine (SYNTHROID, LEVOTHROID) 75 MCG tablet Take 75 mcg by mouth daily.      Marland Kitchen loratadine (CLARITIN) 10 MG tablet Take 10 mg by mouth daily.      . Multiple Vitamin (MULTIVITAMIN WITH MINERALS) TABS Take 1 tablet by mouth every evening.      . NON FORMULARY Oxygen  ---- 3 Liters 24 hours continuous      . Polyethyl Glycol-Propyl Glycol (SYSTANE) 0.4-0.3 % SOLN Apply 1 drop to eye daily as needed. For dry eyes      . simvastatin (ZOCOR) 80 MG tablet Take 80 mg by mouth every evening.      . sodium chloride (OCEAN) 0.65 % nasal spray Place 1 spray into the nose as needed. For congestion      . Wheat Dextrin (BENEFIBER PO) Take 1 tablet by mouth daily.      . digoxin (LANOXIN) 0.125 MG tablet Take one tablet today and tomorrow, then just 1/2 tablet  daily  90 tablet  3  . warfarin (COUMADIN) 6 MG tablet as directed.        Allergies  Allergen Reactions  . Morphine And Related Nausea And Vomiting    Past Medical History  Diagnosis Date  . Pulmonary embolism 2003  . COPD (chronic obstructive pulmonary disease)     on home O2  . Hypertension   . Atrial fibrillation   . Hyperlipidemia   . Prostate cancer   . Colon polyps   . Ulnar fracture   . CVA (cerebral infarction)   . CKD (chronic kidney disease) stage 3, GFR 30-59 ml/min   . Hypothyroidism     Past Surgical History  Procedure Date  . Tee without cardioversion 05/11/2012    Procedure: TRANSESOPHAGEAL ECHOCARDIOGRAM (TEE);  Surgeon: Lewayne Bunting, MD;  Location: [facility] ENDOSCOPY;  Service: Cardiovascular;  Laterality: N/A;  Rm 4N21  . Prostatectomy   . Cholecystectomy   . Appendectomy   . Partial colectomy   . Cataract extraction     bilateral  . Total hip arthroplasty     bilateral    . Splenectomy   . Ivc filter     History  Smoking status  . Former Smoker  Smokeless tobacco  . Never Used    History  Alcohol Use No    History reviewed. No pertinent family history.  Review of Systems: The review of systems is per the HPI.  All other systems were reviewed and are negative.  Physical Exam: BP 126/62  Pulse 129  Ht 5\' 7"  (1.702 m)  Wt 178 lb (80.74 kg)  BMI 27.88 kg/m2 Oxygen sats with walking to the lab are 99 to 92%.  Patient is very pleasant and in no acute distress. He does look chronically ill.  Skin is warm and dry. Color is normal.  HEENT is unremarkable. Normocephalic/atraumatic. PERRL. Sclera are nonicteric. Neck is supple. No masses. No JVD. Lungs are fairly clear. Oxygen is in place. Cardiac exam shows an irregular rhythm. Rate is faster today, 110 by me apically. Abdomen is soft. Extremities are with trace edema. He has his support stockings on today. Gait and ROM are intact. No gross neurologic deficits noted.  LABORATORY DATA: PENDING FOR TODAY  EKG today shows atrial fib with RVR. Rate is 129.     Chemistry      Component Value Date/Time   NA 141 05/27/2012 1008   K 3.7 05/27/2012 1008   CL 104 05/27/2012 1008   CO2 31 05/27/2012 1008   BUN 28* 05/27/2012 1008   CREATININE 1.7* 05/27/2012 1008      Component Value Date/Time   CALCIUM 8.9 05/27/2012 1008   ALKPHOS 63 05/09/2012 1102   AST 27 05/09/2012 1102   ALT 29 05/09/2012 1102   BILITOT 0.3 05/09/2012 1102     Lab Results  Component Value Date   WBC 9.7 05/27/2012   HGB 13.1 05/27/2012   HCT 40.6 05/27/2012   MCV 106.1* 05/27/2012   PLT 161.0 05/27/2012   BNP was lower on 05/27/12 at 569   Assessment / Plan:  1. Systolic heart failure - EF is 40 to 45%. He is on beta blocker and ACE. Will recheck his labs today. He remains short of breath which is felt to be multifactorial (from atrial fib/COPD/CHF). Will refer back to pulmonary. We have left him on his Coreg for his CHF and for his rate  control.   2. Chronic atrial fib - rate is elevated today.  I have talked with Dr. Swaziland. Will try to add low dose Digoxin for rate control. Will need to follow his renal function closely.   3. Chronic coumadin - followed by his PCP  4. Advanced age  78. Recent CVA - back on anticoagulation  6. Remote PE - has a filter in place  We will check labs today, Digoxin is added, and will refer back to pulmonary to see if there are any other options for him. His overall prognosis looks poor.   Patient and family are agreeable to this plan and will call if any problems develop in the interim.

## 2012-06-16 NOTE — Telephone Encounter (Signed)
Left msg for patient to call back regarding labs.  Vista Mink, CMA

## 2012-06-17 NOTE — Telephone Encounter (Signed)
Spoke to patient regarding lab results. Pt aware of lasix recommendation. Pt will have repeat BMET at return visit. Manuel [name], CMA

## 2012-07-01 ENCOUNTER — Encounter: Payer: Self-pay | Admitting: Physician Assistant

## 2012-07-01 ENCOUNTER — Ambulatory Visit (INDEPENDENT_AMBULATORY_CARE_PROVIDER_SITE_OTHER): Payer: Medicare Other | Admitting: Physician Assistant

## 2012-07-01 VITALS — BP 142/74 | HR 80 | Ht 67.0 in | Wt 183.1 lb

## 2012-07-01 DIAGNOSIS — I5022 Chronic systolic (congestive) heart failure: Secondary | ICD-10-CM

## 2012-07-01 DIAGNOSIS — N189 Chronic kidney disease, unspecified: Secondary | ICD-10-CM

## 2012-07-01 DIAGNOSIS — I4891 Unspecified atrial fibrillation: Secondary | ICD-10-CM

## 2012-07-01 LAB — BASIC METABOLIC PANEL
CO2: 32 mEq/L (ref 19–32)
Calcium: 8.8 mg/dL (ref 8.4–10.5)
Creatinine, Ser: 1.5 mg/dL (ref 0.4–1.5)
GFR: 46.92 mL/min — ABNORMAL LOW (ref >60.00)
Sodium: 142 mEq/L (ref 135–145)

## 2012-07-01 NOTE — Patient Instructions (Addendum)
Your physician recommends that you return for lab work in: TODAY BMET, DIGOXIN LEVEL  Your physician recommends that you schedule a follow-up appointment in: 09/01/12 @ 4:15 WITH DR. Swaziland

## 2012-07-01 NOTE — Progress Notes (Signed)
[address] [place], [place]  [address] Phone: [phone] Fax:  [phone]  Date:  07/01/2012   Name:  Manuel [name]   DOB:  Jul 03, 1922   MRN:  [id number]  PCP:  Paulina Fusi, MD  Primary Cardiologist:  Dr. Peter Swaziland  Primary Electrophysiologist:  None    History of Present Illness: Manuel [name] is a 78 y.o. male who returns for follow up.  He has a history of permanent atrial fibrillation, systolic CHF, prior GI bleed on Coumadin, prior stroke, prior pulmonary embolism, status post IVC filter, COPD, on chronic O2, CKD. He was evaluated by Dr. Swaziland 8/13 after recent admission for stroke. Carotid Dopplers demonstrated no significant ICA stenosis. Prior echocardiogram at [facility] in the summer of 2013 demonstrated an EF of 40-45%. Nuclear study demonstrated no ischemia with an EF of 39%. Coumadin was initiated after seeing Dr. Swaziland. He has seen Norma Fredrickson, NP the last 2 visits to this office. When last seen 9/24, she referred him back to pulmonology and also added low-dose digoxin for rate control.  Follow up labs with increased BUN and creatinine and his Lasix dose was decreased.    Since last being seen, his breathing is about the same.  He notes class III DOE.  No orthopnea, PND.  LE edema persists, but no worse.  He continues to wear compression stockings.  He has occasional chest tightness.  No syncope.  Feels lightheaded when he gets short of breath.  Labs (9/13):  K 3.7=>3.5; creatinine 1.7=>1.8  Wt Readings from Last 3 Encounters:  07/01/12 183 lb 1.9 oz (83.063 kg)  06/16/12 178 lb (80.74 kg)  05/27/12 182 lb (82.555 kg)     Past Medical History  Diagnosis Date  . Pulmonary embolism 2003  . COPD (chronic obstructive pulmonary disease)     on home O2  . Hypertension   . Atrial fibrillation   . Hyperlipidemia   . Prostate cancer   . Colon polyps   . Ulnar fracture   . CVA (cerebral infarction)   . CKD (chronic  kidney disease) stage 3, GFR 30-59 ml/min   . Hypothyroidism     Current Outpatient Prescriptions  Medication Sig Dispense Refill  . albuterol (PROVENTIL) (2.5 MG/3ML) 0.083% nebulizer solution Take 2.5 mg by nebulization 3 (three) times daily.      . carvedilol (COREG) 6.25 MG tablet Take 1 tablet (6.25 mg total) by mouth 2 (two) times daily.  180 tablet  3  . cyanocobalamin (,VITAMIN B-12,) 1000 MCG/ML injection Inject 1,000 mcg into the muscle every 30 (thirty) days.      . digoxin (LANOXIN) 0.125 MG tablet Take one tablet today and tomorrow, then just 1/2 tablet daily  90 tablet  3  . febuxostat (ULORIC) 40 MG tablet Take 40 mg by mouth every evening.       . fish oil-omega-3 fatty acids 1000 MG capsule Take 1 g by mouth daily.      . furosemide (LASIX) 80 MG tablet Take 80 mg by mouth daily.      . lansoprazole (PREVACID) 30 MG capsule Take 30 mg by mouth every evening.      Marland Kitchen levothyroxine (SYNTHROID, LEVOTHROID) 75 MCG tablet Take 75 mcg by mouth daily.      Marland Kitchen loratadine (CLARITIN) 10 MG tablet Take 10 mg by mouth daily.      . Multiple Vitamin (MULTIVITAMIN WITH MINERALS) TABS Take 1 tablet by mouth every evening.      Marland Kitchen  NON FORMULARY Oxygen  ---- 3 Liters 24 hours continuous      . Polyethyl Glycol-Propyl Glycol (SYSTANE) 0.4-0.3 % SOLN Apply 1 drop to eye daily as needed. For dry eyes      . simvastatin (ZOCOR) 80 MG tablet Take 80 mg by mouth every evening.      . sodium chloride (OCEAN) 0.65 % nasal spray Place 1 spray into the nose as needed. For congestion      . warfarin (COUMADIN) 5 MG tablet       . warfarin (COUMADIN) 6 MG tablet as directed.      . Wheat Dextrin (BENEFIBER PO) Take 1 tablet by mouth daily.        Allergies: Allergies  Allergen Reactions  . Morphine And Related Nausea And Vomiting    History  Substance Use Topics  . Smoking status: Former Games developer  . Smokeless tobacco: Never Used  . Alcohol Use: No     ROS:  Please see the history of present  illness.   No melena, hematochezia, hematuria.  All other systems reviewed and negative.   PHYSICAL EXAM: VS:  BP 142/74  Pulse 80  Ht 5\' 7"  (1.702 m)  Wt 183 lb 1.9 oz (83.063 kg)  BMI 28.68 kg/m2  SpO2 99%  Chronically ill appearing male in no acute distress HEENT: normal Neck: no JVD Cardiac:  normal S1, S2; irreg irreg rhythm; no murmur Lungs:  Decreased breath sounds bilaterally, no wheezing, rhonchi or rales Abd: soft, nontender, no hepatomegaly Ext: trace to 1+ bilateral LE edema (compression stockings in place) Skin: warm and dry Neuro:  CNs 2-12 intact, no focal abnormalities noted  EKG:  AFib, HR 79, RBBB, no change from prior tracing      ASSESSMENT AND PLAN:  1. Atrial Fibrillation:  Rate better controlled.  With CKD, will check BMET and dig level today.    2. Chronic Systolic CHF:  Dr. Peter Swaziland also saw the patient today.  Volume appears stable.  Check BMET today.  At this point, given his advanced age and co-morbidities, we will proceed with conservative management.  No medication changes today.  Follow up with Dr. Peter Swaziland in 2 mos.  3. COPD:  His breathing is the most bothersome thing for him.  It has been a chronic issue for 10 years since his PE.  He sees the pulmonologist this week.    Signed, Tereso Newcomer, PA-C  9:08 AM 07/01/2012

## 2012-07-02 LAB — DIGOXIN LEVEL: Digoxin Level: 0.8 ng/mL (ref 0.8–2.0)

## 2012-07-03 ENCOUNTER — Telehealth: Payer: Self-pay | Admitting: *Deleted

## 2012-07-03 ENCOUNTER — Encounter: Payer: Self-pay | Admitting: Internal Medicine

## 2012-07-03 ENCOUNTER — Ambulatory Visit (INDEPENDENT_AMBULATORY_CARE_PROVIDER_SITE_OTHER): Payer: Medicare Other | Admitting: Internal Medicine

## 2012-07-03 ENCOUNTER — Telehealth: Payer: Self-pay | Admitting: Internal Medicine

## 2012-07-03 VITALS — BP 142/68 | HR 108 | Temp 97.6°F | Ht 67.0 in | Wt 184.0 lb

## 2012-07-03 DIAGNOSIS — R0989 Other specified symptoms and signs involving the circulatory and respiratory systems: Secondary | ICD-10-CM

## 2012-07-03 DIAGNOSIS — I509 Heart failure, unspecified: Secondary | ICD-10-CM

## 2012-07-03 DIAGNOSIS — J449 Chronic obstructive pulmonary disease, unspecified: Secondary | ICD-10-CM

## 2012-07-03 DIAGNOSIS — R0600 Dyspnea, unspecified: Secondary | ICD-10-CM

## 2012-07-03 MED ORDER — LEVALBUTEROL HCL 0.63 MG/3ML IN NEBU: 1.0000 | INHALATION_SOLUTION | Freq: Four times a day (QID) | RESPIRATORY_TRACT | Status: DC

## 2012-07-03 MED ORDER — BUDESONIDE 0.25 MG/2ML IN SUSP: 0.2500 mg | Freq: Two times a day (BID) | RESPIRATORY_TRACT | Status: DC

## 2012-07-03 MED ORDER — IPRATROPIUM BROMIDE 0.02 % IN SOLN: 500.0000 ug | Freq: Four times a day (QID) | RESPIRATORY_TRACT | Status: DC

## 2012-07-03 NOTE — Patient Instructions (Addendum)
I think you are having a fitness issue called deconditioning making your shortness of breath worse Please start pulmonary rehab at [facility] hospital, referral made - I will get clearance for this from Dr Swaziland and will let you know next week  - any delay starting call us soon Start xopenex low dose neb 4 times daily, atrovent neb 4 times daily and pulmicort neb two times daily along with xopenex as needed; 90 day supply  - if expensive call us immediately Followup 6 weeks or sooner if needed

## 2012-07-03 NOTE — Telephone Encounter (Signed)
Hi Manuel [name]  Is it safe for him to do [facility] rehab ?  Thanks  MR

## 2012-07-03 NOTE — Telephone Encounter (Signed)
Message copied by Tarri Fuller on Fri Jul 03, 2012  8:42 AM ------      Message from: [place], [place] T      Created: Thu Jul 02, 2012  1:01 PM       [facility]      Continue with current treatment plan.      Tereso Newcomer, PA-C  1:01 PM 07/02/2012

## 2012-07-03 NOTE — Assessment & Plan Note (Signed)
Dyspnea is multifactorial. I think baseline hypoxemia and dyspnea is due to copd and chronic chf though he says was due to old PE 10 years ago but clinically he looks more lieke copd than cteph. I will get PFTs to assess this. His worsening since may 2013 is probably related to deconditioning and maybe varibable heart rate and possibly lack of copd nebs. I will restart nebs but use xopenex, atrpovent and pulmicort that wil only have minimal impact on heart rate. In addition, refer to rehab. Will see him back in 4-6 weeks with PFT

## 2012-07-03 NOTE — Telephone Encounter (Signed)
Message copied by Tarri Fuller on Fri Jul 03, 2012  8:36 AM ------      Message from: [place], [place] T      Created: Thu Jul 02, 2012  8:27 AM       K+ low       With renal insufficiency, do not want to give him K+ supplement unless we have to.      Increase dietary K+ (ie eat a banana 3 times a week)      Repeat BMET in 10-14 days.      Digoxin level pending.      Tereso Newcomer, PA-C  8:26 AM 07/02/2012

## 2012-07-03 NOTE — Telephone Encounter (Signed)
pt's wife notified about lab results and to have pt eat a banana 3 x weekly for now and will come in 10/25 for bmet, wife verbalized understanding

## 2012-07-03 NOTE — Progress Notes (Signed)
Subjective:    Patient ID: Manuel [name], male    DOB: [birth year], 78 y.o.   MRN: [id number]  HPI  PCP is [id number] E, MD Cards is Dr Swaziland  Body mass index is 28.82 kg/(m^2).   reports that he quit smoking about 60 years ago. His smoking use included Cigarettes. He has a 25 pack-year smoking history. He has never used smokeless tobacco.   IOV 07/03/2012 Presents with wife and daughter [name]  78 year old male. Hx is not great and he has complex medical problems.   Reportedly diagnosed with post OP PE at [facility] of prostate cancer surgery 10 years ago and since then on O2 dependent (was only on short term coumadin only and per record review has filter in place). Says at baseline a year ago he was able to, with 3L O2, able to mow yard do all ADLs. Then in MAy 2013 sustained rt elbow fracture. Not hospitalized. Since then progressive worsening of dyspnea on exertion. Record review shows 01/24/12 MUGA ef 39% but no no stress ischemia. Then in august , sept 2013 hospitlaized for stroke. Found to have a Fib. Started on coumadin. Apparently was tachycardic and all nebs stopped. Has had progresive dyspnea. Per their report Dr Swaziland has cleared his cardiac status as optimized as much as possible. Currently even changing clothes makes him dyspneic. Says last week when he came out of grocery store he was bradycardic to HR 30 on finger monitor but pulse ox on 3L was in 90s (at office retest we think his  A Fib is prventing good pick up of Heart rate)  Walking desaturation test in office:  At 185 feet x 3 laps on 3L o2 - lowest pulse ox was only 89% and Peak HR was 141 (resting HR was 108)   Of note, he specfically denies a diagnosis of copd, Says o2 need is due to old PE. But he was on nebs. Last PFTs was several years ago and he does not rememver details   CAT COPD Symptom & Quality of Life Score (GSK trademark) 0 is no burden. 5 is highest burden 07/03/2012   Never Cough -> Cough all the  time 1  No phlegm in chest -> Chest is full of phlegm 0  No chest tightness -> Chest feels very tight 3  No dyspnea for 1 flight stairs/hill -> Very dyspneic for 1 flight of stairs 5  No limitations for ADL at home -> Very limited with ADL at home 5  Confident leaving home -> Not at all confident leaving home 0  Sleep soundly -> Do not sleep soundly because of lung condition 1  Lots of Energy -> No energy at all 5  TOTAL Score (max 40)  20      CT Scan 2000 REPORT ONLY  - CLINICAL DATA: THE PATIENT HAS ASBESTOS EXPOSURE. EVALUATE FOR INTERSTITIAL LUNG DISEASE.  CT SCAN OF THE CHEST WITHOUT CONTRAST BUT WITH HIGH RESOLUTION SCANS:  A SERIES OF 8 X 12 MM SCANS THROUGHOUT THE CHEST ARE MADE AND COMPARED TO THE PREVIOUS STUDIES OF  06/20/99 FROM [facility], [place], [place], AND SHOW AGAIN THE PROMINENCE OF THE  PERIBRONCHIAL AND INTERSTITIAL CHANGES ASSOCIATED WITH THE LEFT LINGULA AND LEFT LOWER LOBE.  THERE IS NO CONSOLIDATION, PLEURAL EFFUSION, OR PNEUMOTHORAX. NO PLEURAL PLAQUE FORMATION OR  CALCIFICATION IS SEEN. THERE IS NO EVIDENCE OF NEOPLASM. THE TRACHEOBRONCHIAL TREE APPEARS  WITHIN NORMAL LIMITS.  HIGH RESOLUTION SCANS MADE AT 1 MM INTERVALS  SHOW SOME VOLUME LOSS IN THE REGION OF THE LEFT  LINGULA AND THE LEFT BASE WITH SOME INTERSTITIAL AND PERIBRONCHIAL FIBROSIS AND SCARRING. THERE  IS NO EVIDENCE OF PLEURAL PLAQUE OR CALCIFICATION. NO EVIDENCE OF NEOPLASM IS SEEN.   CXR 05/09/2012 RADIOLOGY REPORT*  Clinical Data: Pain. Speaking unclearly this morning.  CHEST - 2 VIEW  Comparison: 03/31/2012  Findings: The heart is enlarged. There are no focal consolidations  or pleural effusions. No edema. The patient has an inferior vena  cava filter. Surgical clips are identified in the right upper  quadrant of the abdomen.  IMPRESSION:  1. Cardiomegaly.  2.No evidence for acute pulmonary abnormality.  Original Report Authenticated By: Patterson Hammersmith, M.D.  THERE  IS AGAIN NOTED WHAT APPEARS TO BE A SIMPLE CYST ASSOCIATED WITH THE RIGHT KIDNEY WHICH  MEASURES 6.6 X 7.0 CM AND HAS A DENSITY COMPATIBLE WITH A SIMPLE CYST. THE HEART IS MINIMALLY  PROMINENT IN SIZE.  IMPRESSION  PERIBRONCHIAL AND INTERSTITIAL FIBROSIS AND SCARRING ASSOCIATED WITH THE LEFT LINGULA AND LEFT LUNG  BASE WITH SOME DECREASE IN LUNG VOLUME. THERE IS NO PLEURAL THICKENING OR CALCIFICATION. THERE  IS NO EVIDENCE OF NEOPLASM. THERE IS A LARGE SIMPLE CYST OF THE RIGHT KIDNEY.   Past Medical History  Diagnosis Date  . Pulmonary embolism 2003  . COPD (chronic obstructive pulmonary disease)     on home O2  . Hypertension   . Atrial fibrillation   . Hyperlipidemia   . Prostate cancer   . Colon polyps   . Ulnar fracture   . CVA (cerebral infarction)   . CKD (chronic kidney disease) stage 3, GFR 30-59 ml/min   . Hypothyroidism   . Stroke      Family History  Problem Relation Age of Onset  . Heart disease Father   . Ovarian cancer       History   Social History  . Marital Status: Married    Spouse Name: N/A    Number of Children: 2  . 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 Smoking status: Former Smoker -- 1.0 packs/day for 25 years    Types: Cigarettes    Quit date: 09/24/1951  . Smokeless tobacco: Never Used  . Alcohol Use: No  . Drug Use: No  . Sexually Active: Not Currently   Other Topics Concern  . Not on file   Social History Narrative  . No narrative on file     Allergies  Allergen Reactions  . Morphine And Related Nausea And Vomiting     Outpatient Prescriptions Prior to Visit  Medication Sig Dispense Refill  . carvedilol (COREG) 6.25 MG tablet Take 1 tablet (6.25 mg total) by mouth 2 (two) times daily.  180 tablet  3  . cyanocobalamin (,VITAMIN B-12,) 1000 MCG/ML injection Inject 1,000 mcg into the muscle every 30 (thirty) days.      . digoxin (LANOXIN) 0.125 MG tablet Take  one tablet today and tomorrow, then just 1/2 tablet daily  90 tablet  3  . febuxostat (ULORIC) 40 MG tablet Take 40 mg by mouth every evening.       . fish oil-omega-3 fatty acids 1000 MG capsule Take 1 g by mouth daily.      . furosemide (LASIX) 80 MG tablet Take 80 mg by mouth daily.      . lansoprazole (PREVACID) 30 MG capsule Take 30 mg by mouth  every evening.      Marland Kitchen levothyroxine (SYNTHROID, LEVOTHROID) 75 MCG tablet Take 75 mcg by mouth daily.      Marland Kitchen loratadine (CLARITIN) 10 MG tablet Take 10 mg by mouth daily.      . Multiple Vitamin (MULTIVITAMIN WITH MINERALS) TABS Take 1 tablet by mouth every evening.      . NON FORMULARY Oxygen  ---- 3 Liters 24 hours continuous      . Polyethyl Glycol-Propyl Glycol (SYSTANE) 0.4-0.3 % SOLN Apply 1 drop to eye daily as needed. For dry eyes      . simvastatin (ZOCOR) 80 MG tablet Take 80 mg by mouth every evening.      . sodium chloride (OCEAN) 0.65 % nasal spray Place 1 spray into the nose as needed. For congestion      . warfarin (COUMADIN) 6 MG tablet as directed.      . Wheat Dextrin (BENEFIBER PO) Take 1 tablet by mouth daily.      Marland Kitchen albuterol (PROVENTIL) (2.5 MG/3ML) 0.083% nebulizer solution Take 2.5 mg by nebulization 3 (three) times daily.      Marland Kitchen warfarin (COUMADIN) 5 MG tablet            Review of Systems  Constitutional: Negative for fever and unexpected weight change.  HENT: Negative for ear pain, nosebleeds, congestion, sore throat, rhinorrhea, sneezing, trouble swallowing, dental problem, postnasal drip and sinus pressure.   Eyes: Negative for redness and itching.  Respiratory: Positive for shortness of breath. Negative for cough, chest tightness and wheezing.   Cardiovascular: Positive for leg swelling. Negative for palpitations.  Gastrointestinal: Negative for nausea and vomiting.  Genitourinary: Negative for dysuria.  Musculoskeletal: Negative for joint swelling.  Skin: Negative for rash.  Neurological: Negative for  headaches.  Hematological: Does not bruise/bleed easily.  Psychiatric/Behavioral: Negative for dysphoric mood. The patient is not nervous/anxious.        Objective:   Physical Exam  Nursing note and vitals reviewed. Constitutional: He is oriented to person, place, and time. He appears well-developed and well-nourished. No distress.       ----------------------------               07/03/12                      1423        ----------------------------  BP:           142/68        Pulse:         108          Temp:   97.6 F (36.4 C)   TempSrc:       Oral         Height:  5\' 7"  (1.702 m)    Weight: 184 lb (83.462 kg)  SpO2:          95%         ----------------------------   HENT:  Head: Normocephalic and atraumatic.  Right Ear: External ear normal.  Left Ear: External ear normal.  Mouth/Throat: Oropharynx is clear and moist. No oropharyngeal exudate.       Deconditioned looking  Eyes: Conjunctivae normal and EOM are normal. Pupils are equal, round, and reactive to light. Right eye exhibits no discharge. Left eye exhibits no discharge. No scleral icterus.  Neck: Normal range of motion. Neck supple. No JVD present. No tracheal deviation present. No thyromegaly present.  Cardiovascular: Normal rate, regular rhythm and intact distal  pulses.  Exam reveals no gallop and no friction rub.   No murmur heard. Pulmonary/Chest: Effort normal and breath sounds normal. No respiratory distress. He has no wheezes. He has no rales. He exhibits no tenderness.  Abdominal: Soft. Bowel sounds are normal. He exhibits no distension and no mass. There is no tenderness. There is no rebound and no guarding.  Musculoskeletal: Normal range of motion. He exhibits no edema and no tenderness.  Lymphadenopathy:    He has no cervical adenopathy.  Neurological: He is alert and oriented to person, place, and time. He has normal reflexes. No cranial nerve deficit. Coordination  normal.       Slow gait  Skin: Skin is warm and dry. No rash noted. He is not diaphoretic. No erythema. No pallor.  Psychiatric: He has a normal mood and affect. His behavior is normal. Judgment and thought content normal.       Good historian. Cognitively sharp          Assessment & Plan:

## 2012-07-06 ENCOUNTER — Telehealth: Payer: Self-pay | Admitting: Internal Medicine

## 2012-07-06 NOTE — Telephone Encounter (Signed)
[facility] to see [facility] under [facility] and they did not have the RX nor do they have the pt on file. Pharmacists states they have a different name with the pt DOB and different insurance so maybe they filed the rx under the wrong patient. I gave a them the correct infr for the pt and a verbal for the rx. They are going to run the rx again through the correct insurance and pt info and call us back to let us know if it is the same price. Await call back.Carron Curie, CMA

## 2012-07-06 NOTE — Telephone Encounter (Signed)
[facility] returning call[facility]  [phone]

## 2012-07-06 NOTE — Telephone Encounter (Signed)
I spoke with the pt and advised that [facility] sates [facility] do not [facility] medicare card. Pt states he will go to the pharmacy and give them this infol. I advised the pt if he has any issues to call us back.Carron Curie, CMA

## 2012-07-06 NOTE — Telephone Encounter (Signed)
Pt returned call. Manuel [name] °

## 2012-07-06 NOTE — Telephone Encounter (Signed)
I spoke with pt and he stated the pulmicort neb will cost him $700. Looks like this was sent to [facility]. PCC"s can pt get this cheaper through another DME. thanks

## 2012-07-06 NOTE — Telephone Encounter (Signed)
I spoke with the pharmacists and she states the insurance they have on file for the pt is not medicare so if he has medicare he needs to bring that info by the pharmacy so they can try to run it through that. I [id number] to advise the pt. Carron Curie, CMA

## 2012-07-06 NOTE — Telephone Encounter (Signed)
It looks like pulmocort and atrovent went to [facility] may need to speak to [facility]

## 2012-07-07 NOTE — Telephone Encounter (Signed)
It might be safe for him to do pulmonary rehab but I'm not sure he'll be able to perform. We'll see.  Royetta Probus Swaziland MD, [facility]

## 2012-07-08 ENCOUNTER — Telehealth: Payer: Self-pay | Admitting: Internal Medicine

## 2012-07-08 ENCOUNTER — Other Ambulatory Visit: Payer: Self-pay | Admitting: *Deleted

## 2012-07-08 MED ORDER — LEVALBUTEROL HCL 0.63 MG/3ML IN NEBU: 1.0000 | INHALATION_SOLUTION | Freq: Four times a day (QID) | RESPIRATORY_TRACT | Status: DC

## 2012-07-08 NOTE — Addendum Note (Signed)
Addended by: Tommie Sams on: 07/08/2012 08:52 AM   Modules accepted: Orders

## 2012-07-08 NOTE — Telephone Encounter (Signed)
Ok thanks. I will have this communicated with his family. Appreciate it  Thanks MR  PS: Candise Bowens, please tell family that Dr Swaziland is ok with patient starting pulm rehab but we both think we will have to see how well he can perform in it with his physical limitations

## 2012-07-08 NOTE — Telephone Encounter (Signed)
I [facility] and advised DX is on the script at the end of SIG. She looked again and saw the dx code. Nothing further was needed

## 2012-07-08 NOTE — Telephone Encounter (Signed)
[facility] and was advised [facility] insurance requires this to go through [facility]. I called and spoke with pt and made him aware of this. Pt stated to make sure this is sent for 90 day supply. I have sent new rx into the pharmacy. Nothing further was needed

## 2012-07-08 NOTE — Telephone Encounter (Signed)
Pt is aware of MR recs. Looks like pulm rehab [facility] to [facility]. This was suppose to be faxed to [facility] as stated in MR's orders. Also pt still has not received xopenex nebs. [facility] and they need new RX sent to them w/ DX code on it. I have done so. Please advise PCC's thanks

## 2012-07-17 ENCOUNTER — Other Ambulatory Visit (INDEPENDENT_AMBULATORY_CARE_PROVIDER_SITE_OTHER): Payer: Medicare Other

## 2012-07-17 DIAGNOSIS — I509 Heart failure, unspecified: Secondary | ICD-10-CM

## 2012-07-17 LAB — BASIC METABOLIC PANEL
BUN: 23 mg/dL (ref 6–23)
CO2: 31 mEq/L (ref 19–32)
Calcium: 8.9 mg/dL (ref 8.4–10.5)
Glucose, Bld: 104 mg/dL — ABNORMAL HIGH (ref 70–99)
Sodium: 141 mEq/L (ref 135–145)

## 2012-07-20 ENCOUNTER — Telehealth: Payer: Self-pay | Admitting: *Deleted

## 2012-07-20 NOTE — Telephone Encounter (Signed)
lmom labs ok, renal stable, K+ ok, no changes were made

## 2012-07-20 NOTE — Telephone Encounter (Signed)
Message copied by Tarri Fuller on Mon Jul 20, 2012 12:19 PM ------      Message from: [place], [place] T      Created: Mon Jul 20, 2012  8:26 AM       K+ ok.  Renal function stable.      Continue with current treatment plan.      Tereso Newcomer, PA-C  8:26 AM 07/20/2012

## 2012-07-30 ENCOUNTER — Telehealth: Payer: Self-pay | Admitting: Cardiology

## 2012-07-30 NOTE — Telephone Encounter (Signed)
Spoke to Norton at [facility] office wanted to know if ok for patient to have a root canel, post and crown.Will check with Dr.Jordan and call back tomorrow 07/31/12.

## 2012-07-30 NOTE — Telephone Encounter (Signed)
New problem:  Please advise on pre - med before dental appt.  

## 2012-07-31 NOTE — Telephone Encounter (Signed)
Dr Gayleen Orem office closed 07/31/12.Will call back Monday 08/03/12.

## 2012-08-03 NOTE — Telephone Encounter (Signed)
Morrie Sheldon at [facility] office called was told spoke to Dr.Jordan he advised ok for patient to have root canel,post and crown.

## 2012-08-14 ENCOUNTER — Encounter: Payer: Self-pay | Admitting: Internal Medicine

## 2012-08-14 ENCOUNTER — Telehealth: Payer: Self-pay | Admitting: Internal Medicine

## 2012-08-14 ENCOUNTER — Ambulatory Visit (INDEPENDENT_AMBULATORY_CARE_PROVIDER_SITE_OTHER): Payer: Medicare Other | Admitting: Internal Medicine

## 2012-08-14 VITALS — BP 132/72 | HR 79 | Temp 98.1°F | Ht 67.0 in | Wt 191.8 lb

## 2012-08-14 DIAGNOSIS — R0989 Other specified symptoms and signs involving the circulatory and respiratory systems: Secondary | ICD-10-CM

## 2012-08-14 DIAGNOSIS — R0600 Dyspnea, unspecified: Secondary | ICD-10-CM

## 2012-08-14 DIAGNOSIS — I4891 Unspecified atrial fibrillation: Secondary | ICD-10-CM

## 2012-08-14 NOTE — Patient Instructions (Addendum)
I am not sure why you are not better You can mix xopenex and atrovent and for convenience you can just do it twice a day You are not supposed to mix pulmicort with above but lot of people do that I have writtend to Dr Swaziland to address heart issues Please return to see me in 2 months or sooner if needed Continue rehab

## 2012-08-14 NOTE — Progress Notes (Signed)
Subjective:    Patient ID: Manuel [name], male    DOB: [birth year], 78 y.o.   MRN: [id number]  HPI PCP is [id number] E, MD Cards is Dr Swaziland  Body mass index is 28.82 kg/(m^2).   reports that he quit smoking about 60 years ago. His smoking use included Cigarettes. He has a 25 pack-year smoking history.   IOV 07/03/2012 Presents with wife and daughter Manuel [name]  78 year old male. Hx is not great and he has complex medical problems.   Reportedly diagnosed with post OP PE at [facility] [place] of prostate cancer surgery 10 years ago and since then on O2 dependent (was only on short term coumadin only and per record review has filter in place). Says at baseline a year ago he was able to, with 3L O2, able to mow yard do all ADLs. Then in MAy 2013 sustained rt elbow fracture. Not hospitalized. Since then progressive worsening of dyspnea on exertion. Record review shows 01/24/12 MUGA ef 39% but no no stress ischemia. Then in august and sept 2013 hospitlaized for stroke. Found to have a Fib. Started on coumadin. Apparently was tachycardic and all nebs stopped. Has had progresive dyspnea. Per their report Dr Swaziland has cleared his cardiac status as optimized as much as possible. Currently even changing clothes makes him dyspneic. Says last week when he came out of grocery store he was bradycardic to HR 30 on finger monitor but pulse ox on 3L was in 90s (at office retest we think his  A Fib is prventing good pick up of Heart rate)  Walking desaturation test in office:  At 185 feet x 3 laps on 3L o2 - lowest pulse ox was only 89% and Peak HR was 141 (resting HR was 108)   Of note, he specfically denies a diagnosis of copd, Says o2 need is due to old PE. But he was on nebs. Last PFTs was several years ago and he does not rememver details    REC   CT Scan 2000 REPORT ONLY  - CLINICAL DATA: THE PATIENT HAS ASBESTOS EXPOSURE. EVALUATE FOR INTERSTITIAL LUNG DISEASE.  CT SCAN OF THE CHEST WITHOUT CONTRAST BUT WITH  HIGH RESOLUTION SCANS:  A SERIES OF 8 X 12 MM SCANS THROUGHOUT THE CHEST ARE MADE AND COMPARED TO THE PREVIOUS STUDIES OF  06/20/99 FROM [facility], [place], [place], AND SHOW AGAIN THE PROMINENCE OF THE  PERIBRONCHIAL AND INTERSTITIAL CHANGES ASSOCIATED WITH THE LEFT LINGULA AND LEFT LOWER LOBE.  THERE IS NO CONSOLIDATION, PLEURAL EFFUSION, OR PNEUMOTHORAX. NO PLEURAL PLAQUE FORMATION OR  CALCIFICATION IS SEEN. THERE IS NO EVIDENCE OF NEOPLASM. THE TRACHEOBRONCHIAL TREE APPEARS  WITHIN NORMAL LIMITS.  HIGH RESOLUTION SCANS MADE AT 1 MM INTERVALS SHOW SOME VOLUME LOSS IN THE REGION OF THE LEFT  LINGULA AND THE LEFT BASE WITH SOME INTERSTITIAL AND PERIBRONCHIAL FIBROSIS AND SCARRING. THERE  IS NO EVIDENCE OF PLEURAL PLAQUE OR CALCIFICATION. NO EVIDENCE OF NEOPLASM IS SEEN.   CXR 05/09/2012 RADIOLOGY REPORT*  Clinical Data: Pain. Speaking unclearly this morning.  CHEST - 2 VIEW  Comparison: 03/31/2012  Findings: The heart is enlarged. There are no focal consolidations  or pleural effusions. No edema. The patient has an inferior vena  cava filter. Surgical clips are identified in the right upper  quadrant of the abdomen.  IMPRESSION:  1. Cardiomegaly.  2.No evidence for acute pulmonary abnormality.  Original Report Authenticated By: Patterson Hammersmith, M.D.  THERE IS AGAIN NOTED WHAT APPEARS TO BE A  SIMPLE CYST ASSOCIATED WITH THE RIGHT KIDNEY WHICH  MEASURES 6.6 X 7.0 CM AND HAS A DENSITY COMPATIBLE WITH A SIMPLE CYST. THE HEART IS MINIMALLY  PROMINENT IN SIZE.  IMPRESSION  PERIBRONCHIAL AND INTERSTITIAL FIBROSIS AND SCARRING ASSOCIATED WITH THE LEFT LINGULA AND LEFT LUNG  BASE WITH SOME DECREASE IN LUNG VOLUME. THERE IS NO PLEURAL THICKENING OR CALCIFICATION. THERE  IS NO EVIDENCE OF NEOPLASM. THERE IS A LARGE SIMPLE CYST OF THE RIGHT KIDNEY.  REC I think you are having a fitness issue called deconditioning making your shortness of breath worse  Please start pulmonary  rehab at [facility] hospital, referral made - I will get clearance for this from Dr Swaziland and will let you know next week  - any delay starting call us soon  Start xopenex low dose neb 4 times daily, atrovent neb 4 times daily and pulmicort neb two times daily along with xopenex as needed; 90 day supply  - if expensive call us immediately  Followup 6 weeks or sooner if needed    OV 08/14/2012 Fu dyspnea - multifactorial (COPD v klyphosis, CHF, Deconditioning)   No change in dyspnea - still persistent and still bad; even changing clothes makes him dyspneic. Dyspnea associated with fatigue. Says rehab has made him more supple but not helping dspnea. He is compliant iwht xopenex and atroven r4 times daily + pulmicort bid nebs. However, he and wife and he dooes not think it is helping. Also, nebs impacting lifestyle and wants to know if they can be mixed. Of note, he has gained 8# since last visit per hx and he says this is all fluid weight. He has an upcoming appt with Dr Swaziland who has confided in me patient has hx of high salt intake and poor dietary control  Spirooemtry office today - fev1 1.9L/69%, Ratio 75 - c/w restriction (he has kyphosis)   CAT COPD Symptom & Quality of Life Score (GSK trademark) 0 is no burden. 5 is highest burden 07/03/2012  08/14/2012   Never Cough -> Cough all the time 1 1  No phlegm in chest -> Chest is full of phlegm 0 0  No chest tightness -> Chest feels very tight 3 2  No dyspnea for 1 flight stairs/hill -> Very dyspneic for 1 flight of stairs 5 5  No limitations for ADL at home -> Very limited with ADL at home 5 5  Confident leaving home -> Not at all confident leaving home 0 0  Sleep soundly -> Do not sleep soundly because of lung condition 1 0  Lots of Energy -> No energy at all 5 4  TOTAL Score (max 40)  20 17     Review of Systems  Constitutional: Negative for fever and unexpected weight change.  HENT: Negative for ear pain, nosebleeds, congestion,  sore throat, rhinorrhea, sneezing, trouble swallowing, dental problem, postnasal drip and sinus pressure.   Eyes: Negative for redness and itching.  Respiratory: Positive for shortness of breath. Negative for cough, chest tightness and wheezing.   Cardiovascular: Positive for leg swelling. Negative for palpitations.  Gastrointestinal: Negative for nausea and vomiting.  Genitourinary: Negative for dysuria.  Musculoskeletal: Negative for joint swelling.  Skin: Negative for rash.  Neurological: Negative for headaches.  Hematological: Does not bruise/bleed easily.  Psychiatric/Behavioral: Negative for dysphoric mood. The patient is not nervous/anxious.        Objective:   Physical Exam Nursing note and vitals reviewed. Constitutional: He is oriented to person, place, and time. He  appears well-developed and well-nourished. No distress.       ------------------------------   Filed Vitals:   08/14/12 1401  BP: 132/72  Pulse: 79  Temp: 98.1 F (36.7 C)  TempSrc: Oral  Height: 5\' 7"  (1.702 m)  Weight: 191 lb 12.8 oz (87 kg)  SpO2: 95%    HENT:  Head: Normocephalic and atraumatic.  Right Ear: External ear normal.  Left Ear: External ear normal.  Mouth/Throat: Oropharynx is clear and moist. No oropharyngeal exudate.       Deconditioned looking  Eyes: Conjunctivae normal and EOM are normal. Pupils are equal, round, and reactive to light. Right eye exhibits no discharge. Left eye exhibits no discharge. No scleral icterus.  Neck: Normal range of motion. Neck supple. No JVD present. No tracheal deviation present. No thyromegaly present.  Cardiovascular: Normal rate, regular rhythm and intact distal pulses.  Exam reveals no gallop and no friction rub.   No murmur heard. Pulmonary/Chest: Effort normal and breath sounds normal. No respiratory distress. He has no wheezes. He has no rales. He exhibits no tenderness.  Abdominal: Soft. Bowel sounds are normal. He exhibits no distension and no  mass. There is no tenderness. There is no rebound and no guarding.  Musculoskeletal: Normal range of motion. He exhibits no edema and no tenderness.  Lymphadenopathy:    He has no cervical adenopathy.  Neurological: He is alert and oriented to person, place, and time. He has normal reflexes. No cranial nerve deficit. Coordination normal.       Slow gait  Skin: Skin is warm and dry. No rash noted. He is not diaphoretic. No erythema. No pallor.  Psychiatric: He has a normal mood and affect. His behavior is normal. Judgment and thought content normal.       Good historian. Cognitively sharp           Assessment & Plan:

## 2012-08-14 NOTE — Telephone Encounter (Signed)
Hi Manuel [name]  I am unable to find pulm cause of dyspnea. He has restriction and could be due to kyphosis. My initial though for dysponea was deconditioning. But rehab has not helped. He sees me today saying no better. Also inc edema and weight up 8#.   I am wondering a) you can caddress his edema; b) readdress his ef status with another echo;c )  Assess right heart pressures due to his PE hx  Thanks  MR

## 2012-08-17 ENCOUNTER — Telehealth: Payer: Self-pay | Admitting: Cardiology

## 2012-08-17 ENCOUNTER — Ambulatory Visit: Payer: Medicare Other | Admitting: Internal Medicine

## 2012-08-17 NOTE — Telephone Encounter (Signed)
Follow-up:    Patient returned your call.  Please call back. 

## 2012-08-17 NOTE — Telephone Encounter (Signed)
Patient called no answer.LMTC. 

## 2012-08-17 NOTE — Telephone Encounter (Signed)
See 08/17/12 note.

## 2012-08-17 NOTE — Telephone Encounter (Signed)
Patient called spoke to wife was told Dr.Jordan advised needs echocardiogram.Schedulers will call to schedule.

## 2012-08-17 NOTE — Telephone Encounter (Signed)
Thanks.  Did not know his Na intake is high  Depending on course, we can do a right heart cath; might give some prognostic info if echo does not help.

## 2012-08-17 NOTE — Telephone Encounter (Signed)
I think updating his Echo is reasonable and we can follow up concerning his edema. ( sodium intake is a major issue since he eats out all the time ). I can't image a right heart cath is going to add anything to his treatment.  Dayonna Selbe [place] MD, [facility]

## 2012-08-17 NOTE — Addendum Note (Signed)
Addended by: Meda Klinefelter D on: 08/17/2012 03:06 PM   Modules accepted: Orders

## 2012-08-23 NOTE — Assessment & Plan Note (Signed)
He is no better despite ongoing pulmonary rehab and reported compliance with meds. He has gained 8# in weight and Dr [place] mentions his salt intake is high despite advice. I have asked Dr [place] to re-evaluate his cardiac status with echo. I have advisee him to mix his nebs for sake of convenience. Will see him again in few months

## 2012-08-25 ENCOUNTER — Ambulatory Visit (HOSPITAL_COMMUNITY): Payer: Medicare Other | Attending: Cardiovascular Disease | Admitting: Radiology

## 2012-08-25 ENCOUNTER — Other Ambulatory Visit (HOSPITAL_COMMUNITY): Payer: Self-pay | Admitting: Radiology

## 2012-08-25 DIAGNOSIS — R609 Edema, unspecified: Secondary | ICD-10-CM | POA: Insufficient documentation

## 2012-08-25 DIAGNOSIS — I4891 Unspecified atrial fibrillation: Secondary | ICD-10-CM | POA: Insufficient documentation

## 2012-08-25 DIAGNOSIS — I079 Rheumatic tricuspid valve disease, unspecified: Secondary | ICD-10-CM | POA: Insufficient documentation

## 2012-08-25 DIAGNOSIS — I059 Rheumatic mitral valve disease, unspecified: Secondary | ICD-10-CM | POA: Insufficient documentation

## 2012-08-25 DIAGNOSIS — I379 Nonrheumatic pulmonary valve disorder, unspecified: Secondary | ICD-10-CM | POA: Insufficient documentation

## 2012-08-25 DIAGNOSIS — I359 Nonrheumatic aortic valve disorder, unspecified: Secondary | ICD-10-CM | POA: Insufficient documentation

## 2012-08-25 DIAGNOSIS — I517 Cardiomegaly: Secondary | ICD-10-CM | POA: Insufficient documentation

## 2012-08-25 NOTE — Progress Notes (Signed)
Echocardiogram performed.  

## 2012-09-01 ENCOUNTER — Ambulatory Visit (INDEPENDENT_AMBULATORY_CARE_PROVIDER_SITE_OTHER): Payer: Medicare Other | Admitting: Cardiology

## 2012-09-01 ENCOUNTER — Encounter: Payer: Self-pay | Admitting: Cardiology

## 2012-09-01 VITALS — BP 158/74 | HR 98 | Ht 67.0 in | Wt 184.1 lb

## 2012-09-01 DIAGNOSIS — I272 Pulmonary hypertension, unspecified: Secondary | ICD-10-CM

## 2012-09-01 DIAGNOSIS — I509 Heart failure, unspecified: Secondary | ICD-10-CM

## 2012-09-01 DIAGNOSIS — I4891 Unspecified atrial fibrillation: Secondary | ICD-10-CM

## 2012-09-01 DIAGNOSIS — I2789 Other specified pulmonary heart diseases: Secondary | ICD-10-CM

## 2012-09-01 DIAGNOSIS — I5022 Chronic systolic (congestive) heart failure: Secondary | ICD-10-CM

## 2012-09-01 DIAGNOSIS — I5042 Chronic combined systolic (congestive) and diastolic (congestive) heart failure: Secondary | ICD-10-CM

## 2012-09-01 DIAGNOSIS — N189 Chronic kidney disease, unspecified: Secondary | ICD-10-CM

## 2012-09-01 DIAGNOSIS — R0600 Dyspnea, unspecified: Secondary | ICD-10-CM

## 2012-09-01 DIAGNOSIS — R0989 Other specified symptoms and signs involving the circulatory and respiratory systems: Secondary | ICD-10-CM

## 2012-09-01 MED ORDER — DIGOXIN 125 MCG PO TABS: ORAL_TABLET | ORAL | Status: DC

## 2012-09-01 NOTE — Patient Instructions (Signed)
Increase your Lasix to 80 mg in the morning and 40 mg in the afternoon.  Dr. Tomasa Blase will follow up on your blood work  I will see you again in one month

## 2012-09-01 NOTE — Progress Notes (Signed)
Manuel [name] Date of Birth: 05-24-1922 Medical Record #[id number]  History of Present Illness: Manuel [name] is seen back today for a followup visit. He has multiple medical issues which include CHF, COPD, chronic atrial fib, past GI bleed, recent CVA, remote PE with a vena cava filter in place, chronic oxygen therapy, COPD, CKD  and advanced age. He has had an echo and stress test done at [facility] earlier this summer after a broken arm. EF is 40 to 45% per the echo. Nuclear with no ischemia and EF was 39%. Now back on coumadin given the recent CVA which is monitored by his PCP. He has been followed closely by Dr. Marchelle Gearing and pulmonary. He had a CT of his chest and pulmonary function studies. He has a moderate restrictive defect. He has been tried on multiple nebulizers without any improvement in his dyspnea. Salt intake is still an issue. He tells for virtually every meal. He does try to get low-sodium items when he can including asking for french fries without salt. He reports that his weight has increased recently. He denies any increase in edema. He is currently taking 80 mg of Lasix a day.  Current Outpatient Prescriptions on File Prior to Visit  Medication Sig Dispense Refill  . budesonide (PULMICORT) 0.25 MG/2ML nebulizer solution Take 2 mLs (0.25 mg total) by nebulization 2 (two) times daily.  120 mL  6  . carvedilol (COREG) 6.25 MG tablet Take 1 tablet (6.25 mg total) by mouth 2 (two) times daily.  180 tablet  3  . cyanocobalamin (,VITAMIN B-12,) 1000 MCG/ML injection Inject 1,000 mcg into the muscle every 30 (thirty) days.      . febuxostat (ULORIC) 40 MG tablet Take 40 mg by mouth every evening.       . fish oil-omega-3 fatty acids 1000 MG capsule Take 1 g by mouth daily.      . furosemide (LASIX) 80 MG tablet Take 80 mg by mouth daily.      Marland Kitchen ipratropium-albuterol (DUONEB) 0.5-2.5 (3) MG/3ML SOLN Take 3 mLs by nebulization every 6 (six) hours as needed.      . lansoprazole (PREVACID) 30  MG capsule Take 30 mg by mouth every evening.      Marland Kitchen levothyroxine (SYNTHROID, LEVOTHROID) 75 MCG tablet Take 75 mcg by mouth daily.      Marland Kitchen loratadine (CLARITIN) 10 MG tablet Take 10 mg by mouth daily.      . Multiple Vitamin (MULTIVITAMIN WITH MINERALS) TABS Take 1 tablet by mouth every evening.      . NON FORMULARY Oxygen  ---- 3 Liters 24 hours continuous      . Polyethyl Glycol-Propyl Glycol (SYSTANE) 0.4-0.3 % SOLN Apply 1 drop to eye daily as needed. For dry eyes      . simvastatin (ZOCOR) 80 MG tablet Take 80 mg by mouth every evening.      . sodium chloride (OCEAN) 0.65 % nasal spray Place 1 spray into the nose as needed. For congestion      . warfarin (COUMADIN) 6 MG tablet as directed.      . Wheat Dextrin (BENEFIBER PO) Take 1 tablet by mouth daily.      . [DISCONTINUED] digoxin (LANOXIN) 0.125 MG tablet Take one tablet today and tomorrow, then just 1/2 tablet daily  90 tablet  3  . [DISCONTINUED] ipratropium (ATROVENT) 0.02 % nebulizer solution Take 2.5 mLs (500 mcg total) by nebulization 4 (four) times daily.  300 mL  6  . [DISCONTINUED]  levalbuterol (XOPENEX) 0.63 MG/3ML nebulizer solution Take 3 mLs (0.63 mg total) by nebulization 4 (four) times daily. DX 496, 427.31  1080 mL  1    Allergies  Allergen Reactions  . Morphine And Related Nausea And Vomiting    Past Medical History  Diagnosis Date  . Pulmonary embolism 2003  . COPD (chronic obstructive pulmonary disease)     on home O2  . Hypertension   . Atrial fibrillation   . Hyperlipidemia   . Prostate cancer   . Colon polyps   . Ulnar fracture   . CVA (cerebral infarction)   . CKD (chronic kidney disease) stage 3, GFR 30-59 ml/min   . Hypothyroidism   . Stroke     Past Surgical History  Procedure Date  . Tee without cardioversion 05/11/2012    Procedure: TRANSESOPHAGEAL ECHOCARDIOGRAM (TEE);  Surgeon: Lewayne Bunting, MD;  Location: [facility] ENDOSCOPY;  Service: Cardiovascular;  Laterality: N/A;  Rm 4N21  .  Prostatectomy   . Cholecystectomy   . Appendectomy   . Partial colectomy   . Cataract extraction     bilateral  . Total hip arthroplasty     bilateral  . Splenectomy   . Ivc filter     History  Smoking status  . Former Smoker -- 1.0 packs/day for 25 years  . Types: Cigarettes  . Quit date: 09/24/1951  Smokeless tobacco  . Never Used    History  Alcohol Use No    Family History  Problem Relation Age of Onset  . Heart disease Father   . Ovarian cancer      Review of Systems: The review of systems is per the HPI. He did have a recent root canal. He states he was treated with 4 courses of antibiotics for continued toothache. Last week he developed diarrhea related to this. His diarrhea has since resolved.  All other systems were reviewed and are negative.  Physical Exam: BP 158/74  Pulse 98  Ht 5\' 7"  (1.702 m)  Wt 184 lb 1.9 oz (83.516 kg)  BMI 28.84 kg/m2  SpO2 97% Patient is a pleasant elderly white male in no acute distress. He does look chronically ill.  Skin is warm and dry. Color is normal.  HEENT is unremarkable. Normocephalic/atraumatic. PERRL. Sclera are nonicteric. Neck is supple. No masses. No JVD. Lungs are fairly clear. Oxygen is in place. Cardiac exam shows an irregular rhythm. Abdomen is soft. Extremities are with trace edema. He has his support stockings on today. Gait and ROM are intact. No gross neurologic deficits noted.  LABORATORY DATA:  Transthoracic Echocardiography  Patient: Manuel [name], Manuel [name] MR #: [id number] Study Date: 08/25/2012 Gender: M Age: 78 Height: 170.2cm Weight: 83kg BSA: 1.20m^2 Pt. Status: Room:  ATTENDING [place], Caydee Talkington ORDERING [place], Ameerah Huffstetler REFERRING [place], Chemika Nightengale SONOGRAPHER Luvenia Redden, RDCS PERFORMING [facility], [facility] cc: Dr. Foye Deer and [facility] [email or web address] Fax [phone]  ------------------------------------------------------------ LV EF: 45% LV EF: 40% -  45%  ------------------------------------------------------------ Indications: Atrial fibrillation - 427.31. Edema 782.3.  ------------------------------------------------------------ History: PMH: Acquired from the patient and from the patient's chart. S/P IVC filter. Bilateral lower extremity edema. Renal disease. Chronic obstructive pulmonary disease. The patient uses continuous supplemental oxygen.  ------------------------------------------------------------ Study Conclusions  - Left ventricle: The cavity size was mildly dilated. Wall thickness was normal. Systolic function was mildly to moderately reduced. The estimated ejection fraction was 45%, in the range of 40% to 45%. Diffuse hypokinesis. The study is not technically sufficient to allow  evaluation of LV diastolic function. - Aortic valve: Mild regurgitation. - Mitral valve: Mild regurgitation. - Left atrium: The atrium was moderately dilated. - Right ventricle: The cavity size was mildly dilated. - Right atrium: The atrium was mildly dilated. - Tricuspid valve: Moderate regurgitation. - Pulmonary arteries: Systolic pressure was moderately increased. PA peak pressure: 50mm Hg (S). - Pericardium, extracardiac: A trivial pericardial effusion was identified. Transthoracic echocardiography. M-mode, complete 2D, spectral Doppler, and color Doppler. Height: Height: 170.2cm. Height: 67in. Weight: Weight: 83kg. Weight: 182.6lb. Body mass index: BMI: 28.7kg/m^2. Body surface area: BSA: 1.63m^2. Blood pressure: 132/72. Patient status: Outpatient. Location: [facility]  ------------------------------------------------------------  ------------------------------------------------------------ Left ventricle: The cavity size was mildly dilated. Wall thickness was normal. Systolic function was mildly to moderately reduced. The estimated ejection fraction was 45%, in the range of 40% to 45%. Diffuse hypokinesis. The  study is not technically sufficient to allow evaluation of LV diastolic function.  ------------------------------------------------------------ Aortic valve: Trileaflet; mildly thickened leaflets. Mobility was not restricted. Doppler: Transvalvular velocity was within the normal range. There was no stenosis. Mild regurgitation.  ------------------------------------------------------------ Aorta: Aortic root: The aortic root was mildly dilated.  ------------------------------------------------------------ Mitral valve: Mildly thickened leaflets . Mobility was not restricted. Doppler: Transvalvular velocity was within the normal range. There was no evidence for stenosis. Mild regurgitation. Peak gradient: 3mm Hg (D).  ------------------------------------------------------------ Left atrium: The atrium was moderately dilated.  ------------------------------------------------------------ Right ventricle: The cavity size was mildly dilated. Systolic function was normal.  ------------------------------------------------------------ Pulmonic valve: Doppler: Transvalvular velocity was within the normal range. There was no evidence for stenosis. Mild regurgitation.  ------------------------------------------------------------ Tricuspid valve: Structurally normal valve. Doppler: Transvalvular velocity was within the normal range. Moderate regurgitation.  ------------------------------------------------------------ Pulmonary artery: Systolic pressure was moderately increased.  ------------------------------------------------------------ Right atrium: The atrium was mildly dilated.  ------------------------------------------------------------ Pericardium: A trivial pericardial effusion was identified.  ------------------------------------------------------------  2D measurements Normal Doppler measurements Normal Left ventricle Main pulmonary LVID ED, 55.4 mm 43-52  artery chord, Pressure, S 50 mm =30 PLAX Hg LVID ES, 42 mm 23-38 Pressure ED 13 mm ------ chord, Hg PLAX LVOT FS, chord, 24 % >29 Peak vel, S 69. cm/s ------ PLAX 9 LVPW, ED 9.75 mm ------ VTI, S 11. cm ------ IVS/LVPW 0.93 <1.3 7 ratio, ED Mitral valve Ventricular septum Peak E vel 81. cm/s ------ IVS, ED 9.11 mm ------ 4 LVOT Deceleration 141 ms 150-23 Diam, S 23 mm ------ time 0 Area 4.15 cm^2 ------ Peak 3 mm ------ Aorta gradient, D Hg Root diam, 38 mm ------ Tricuspid valve ED Regurg peak 337 cm/s ------ Left atrium vel AP dim 51 mm ------ Peak RV-RA 45 mm ------ AP dim 2.62 cm/m^2 <2.2 gradient, S Hg index Systemic veins Estimated CVP 5 mm ------ Hg Right ventricle Pressure, S 50 mm <30 Hg Pulmonic valve Regurg vel, 141 cm/s ------ ED  ------------------------------------------------------------ Prepared and Electronically Authenticated by  Olga Millers 2013-12-03T13:53:14.550   Assessment / Plan:  1. Systolic heart failure - EF is 40 to 45%. This is stable by recent echo. He is on beta blocker and ACE.  He remains short of breath which is felt to be multifactorial (from atrial fib/COPD/CHF). Sodium intake is an issue. I recommended increasing his Lasix to 80 mg in the morning and 40 mg in the evening. He is going to have followup lab work with Dr. Tomasa Blase in about 2 and half weeks. We will see if increased diuresis will help.  2. Chronic atrial fib - rate control appears to  be adequate on digoxin and Coreg. Recent dig level was 0.8.  3. Chronic coumadin - followed by his PCP  4. Advanced age  50. Recent CVA - back on anticoagulation  6. Remote PE - has a filter in place  7. COPD with restrictive lung disease.  8. Moderate pulmonary hypertension by echocardiogram. This is most likely related to his chronic heart failure and pulmonary disease. I had previously discussed right heart catheterization with Dr. Marchelle Gearing but I see little benefit to this  procedure since it will not substantially change his treatment.  I had a lengthy discussion with Mr. Nick and his daughter. I explained that his symptoms of shortness of breath are multifactorial and that there is not really a fixable problem. He appears to be on optimal therapy from both a pulmonary and cardiac standpoint. We will try to diurese him a little bit more. I think our efforts are limited and his sodium intake is an issue. I don't think that he is going to be able to make much change in his diet at this point given his age and life circumstance. I'll followup again in one month.

## 2012-09-17 ENCOUNTER — Encounter: Payer: Self-pay | Admitting: Cardiology

## 2012-09-22 ENCOUNTER — Emergency Department (HOSPITAL_COMMUNITY): Payer: Medicare Other

## 2012-09-22 ENCOUNTER — Encounter (HOSPITAL_COMMUNITY): Payer: Self-pay | Admitting: Emergency Medicine

## 2012-09-22 ENCOUNTER — Emergency Department (HOSPITAL_COMMUNITY)
Admission: EM | Admit: 2012-09-22 | Discharge: 2012-09-22 | Disposition: A | Discharge status: Home / Self Care | Payer: Medicare Other | Attending: Emergency Medicine | Admitting: Emergency Medicine

## 2012-09-22 DIAGNOSIS — N183 Chronic kidney disease, stage 3 unspecified: Secondary | ICD-10-CM | POA: Insufficient documentation

## 2012-09-22 DIAGNOSIS — Z86711 Personal history of pulmonary embolism: Secondary | ICD-10-CM | POA: Insufficient documentation

## 2012-09-22 DIAGNOSIS — Z87891 Personal history of nicotine dependence: Secondary | ICD-10-CM | POA: Insufficient documentation

## 2012-09-22 DIAGNOSIS — S6990XA Unspecified injury of unspecified wrist, hand and finger(s), initial encounter: Secondary | ICD-10-CM | POA: Insufficient documentation

## 2012-09-22 DIAGNOSIS — Z7901 Long term (current) use of anticoagulants: Secondary | ICD-10-CM | POA: Insufficient documentation

## 2012-09-22 DIAGNOSIS — E039 Hypothyroidism, unspecified: Secondary | ICD-10-CM | POA: Insufficient documentation

## 2012-09-22 DIAGNOSIS — Z860100 Personal history of colon polyps, unspecified: Secondary | ICD-10-CM | POA: Insufficient documentation

## 2012-09-22 DIAGNOSIS — I129 Hypertensive chronic kidney disease with stage 1 through stage 4 chronic kidney disease, or unspecified chronic kidney disease: Secondary | ICD-10-CM | POA: Insufficient documentation

## 2012-09-22 DIAGNOSIS — Y92009 Unspecified place in unspecified non-institutional (private) residence as the place of occurrence of the external cause: Secondary | ICD-10-CM | POA: Insufficient documentation

## 2012-09-22 DIAGNOSIS — S59909A Unspecified injury of unspecified elbow, initial encounter: Secondary | ICD-10-CM | POA: Insufficient documentation

## 2012-09-22 DIAGNOSIS — Y9301 Activity, walking, marching and hiking: Secondary | ICD-10-CM | POA: Insufficient documentation

## 2012-09-22 DIAGNOSIS — W19XXXA Unspecified fall, initial encounter: Secondary | ICD-10-CM

## 2012-09-22 DIAGNOSIS — Z8673 Personal history of transient ischemic attack (TIA), and cerebral infarction without residual deficits: Secondary | ICD-10-CM | POA: Insufficient documentation

## 2012-09-22 DIAGNOSIS — S0003XA Contusion of scalp, initial encounter: Secondary | ICD-10-CM | POA: Insufficient documentation

## 2012-09-22 DIAGNOSIS — J4489 Other specified chronic obstructive pulmonary disease: Secondary | ICD-10-CM | POA: Insufficient documentation

## 2012-09-22 DIAGNOSIS — J449 Chronic obstructive pulmonary disease, unspecified: Secondary | ICD-10-CM | POA: Insufficient documentation

## 2012-09-22 DIAGNOSIS — Z9981 Dependence on supplemental oxygen: Secondary | ICD-10-CM | POA: Insufficient documentation

## 2012-09-22 DIAGNOSIS — W010XXA Fall on same level from slipping, tripping and stumbling without subsequent striking against object, initial encounter: Secondary | ICD-10-CM | POA: Insufficient documentation

## 2012-09-22 DIAGNOSIS — S1093XA Contusion of unspecified part of neck, initial encounter: Secondary | ICD-10-CM | POA: Insufficient documentation

## 2012-09-22 DIAGNOSIS — Z8781 Personal history of (healed) traumatic fracture: Secondary | ICD-10-CM | POA: Insufficient documentation

## 2012-09-22 DIAGNOSIS — E785 Hyperlipidemia, unspecified: Secondary | ICD-10-CM | POA: Insufficient documentation

## 2012-09-22 DIAGNOSIS — Z8546 Personal history of malignant neoplasm of prostate: Secondary | ICD-10-CM | POA: Insufficient documentation

## 2012-09-22 DIAGNOSIS — T148XXA Other injury of unspecified body region, initial encounter: Secondary | ICD-10-CM

## 2012-09-22 DIAGNOSIS — Z8679 Personal history of other diseases of the circulatory system: Secondary | ICD-10-CM | POA: Insufficient documentation

## 2012-09-22 NOTE — ED Provider Notes (Signed)
Medical screening examination/treatment/procedure(s) were conducted as a shared visit with non-physician practitioner(s) and myself.  I personally evaluated the patient during the encounter Pt with mechanical fall yesterday.  Evidence of injury to the face.  No N/V or headache.  No anticoagulants.  CT neg and pt d/ced home.  Manuel Sprout, MD 09/22/12 [phone]

## 2012-09-22 NOTE — ED Notes (Addendum)
Pt presents with family and referred by Dr. Shon Baton of [facility] orthopedics. Pt fell yesterday at 0730. Pt went to urgent care yesterday and was bandaged to control bleeding and cover abrasions; was discharged with no other complaints. Pt awoke this morning with left hand swollen and in pain. This morning awoke with racoon eyes with brusing that surrounds both eyes, nose and forehead between the eyebrows. Pt went today to get checked at [facility] orthopedics for a fracture of the right hand/wrist and get it re-evaluated for re-fracture. Dr. Shon Baton referred pt to ED for evaulation.

## 2012-09-22 NOTE — ED Notes (Signed)
Patient transported to CT 

## 2012-09-22 NOTE — ED Provider Notes (Signed)
History     CSN: [id number]  Arrival date & time 09/22/12  1720   First MD Initiated Contact with Patient 09/22/12 1735      Chief Complaint  Patient presents with  . Fall    (Consider location/radiation/quality/duration/timing/severity/associated sxs/prior treatment) The history is provided by the patient, medical records, a relative and the spouse.    Manuel [name] is a 78 y.o. male  with a hx of PE, COPD, hypertension, A. fib, hyperlipidemia, nonhealing ulnar fracture of the right, CVA, [facility], presents to the Emergency Department complaining of gradual, persistent, progressively worsening bruising to the face and eyes after fall onset yesterday afternoon.  The patient was walking up the stairs in his home when he tripped and fell backwards hitting the car. He states he then would and hit the ground.  He has pain in his left wrist.  He is also complaining of significant bruising to his face.  He denies pain in any other portion of his body.  He states he's not sure why he fell, he does remember the fall.  Associated symptoms include bruising and swelling of the face, pain in the left arm.  Patient initially seen by the orthopedist to x-ray the left arm and did not find any fractures, he is a splint in place. Patient also has a splint in place on the right arm, nonhealing ulnar fracture.  Nothing makes it better and nothing makes it worse.  Pt denies fever, chills, headache, neck pain, back pain, chest pain, shortness of breath, no pain, nausea, vomiting, diarrhea, dizziness, numbness, loss of bowel or bladder function, saddle anesthesia, gait difficulty.     Past Medical History  Diagnosis Date  . Pulmonary embolism 2003  . COPD (chronic obstructive pulmonary disease)     on home O2  . Hypertension   . Atrial fibrillation   . Hyperlipidemia   . Prostate cancer   . Colon polyps   . Ulnar fracture   . CVA (cerebral infarction)   . CKD (chronic kidney disease) stage 3, GFR 30-59  ml/min   . Hypothyroidism   . Stroke     Past Surgical History  Procedure Date  . Tee without cardioversion 05/11/2012    Procedure: TRANSESOPHAGEAL ECHOCARDIOGRAM (TEE);  Surgeon: Lewayne Bunting, MD;  Location: [facility] ENDOSCOPY;  Service: Cardiovascular;  Laterality: N/A;  Rm 4N21  . Prostatectomy   . Cholecystectomy   . Appendectomy   . Partial colectomy   . Cataract extraction     bilateral  . Total hip arthroplasty     bilateral  . Splenectomy   . Ivc filter     Family History  Problem Relation Age of Onset  . Heart disease Father   . Ovarian cancer      History  Substance Use Topics  . Smoking status: Former Smoker -- 1.0 packs/day for 25 years    Types: Cigarettes    Quit date: 09/24/1951  . Smokeless tobacco: Never Used  . Alcohol Use: No      Review of Systems  Constitutional: Negative for fever, diaphoresis, appetite change, fatigue and unexpected weight change.  HENT: Positive for facial swelling. Negative for ear pain, nosebleeds, congestion, rhinorrhea, sneezing, mouth sores, trouble swallowing, neck pain, neck stiffness and sinus pressure.   Eyes: Negative for visual disturbance.  Respiratory: Negative for cough, chest tightness, shortness of breath and wheezing.   Cardiovascular: Negative for chest pain.  Gastrointestinal: Negative for nausea, vomiting, abdominal pain, diarrhea and constipation.  Genitourinary: Negative for dysuria, urgency, frequency and hematuria.  Musculoskeletal: Positive for arthralgias. Negative for joint swelling and gait problem.  Skin: Negative for rash.  Neurological: Negative for syncope, light-headedness and headaches.  Psychiatric/Behavioral: Negative for sleep disturbance. The patient is not nervous/anxious.   All other systems reviewed and are negative.    Allergies  Morphine and related  Home Medications   Current Outpatient Rx  Name  Route  Sig  Dispense  Refill  . ACETAMINOPHEN 500 MG PO TABS   Oral   Take  1,000 mg by mouth every 6 (six) hours as needed. pain         . BUDESONIDE 0.25 MG/2ML IN SUSP   Nebulization   Take 2 mLs (0.25 mg total) by nebulization 2 (two) times daily.   120 mL   6   . CARVEDILOL 6.25 MG PO TABS   Oral   Take 1 tablet (6.25 mg total) by mouth 2 (two) times daily.   180 tablet   3   . CYANOCOBALAMIN 1000 MCG/ML IJ SOLN   Intramuscular   Inject 1,000 mcg into the muscle every 30 (thirty) days.         Marland Kitchen DIGOXIN 0.125 MG PO TABS      Take one tablet today and tomorrow, then just 1/2 tablet daily   90 tablet   3   . FEBUXOSTAT 40 MG PO TABS   Oral   Take 40 mg by mouth every evening.          Marland Kitchen OMEGA-3 FATTY ACIDS 1000 MG PO CAPS   Oral   Take 1 g by mouth daily.         . FUROSEMIDE 80 MG PO TABS   Oral   Take 80 mg by mouth daily.         . IPRATROPIUM BROMIDE 0.02 % IN SOLN   Nebulization   Take 500 mcg by nebulization 2 (two) times daily.         . IPRATROPIUM-ALBUTEROL 0.5-2.5 (3) MG/3ML IN SOLN   Nebulization   Take 3 mLs by nebulization every 6 (six) hours as needed.         Marland Kitchen LANSOPRAZOLE 30 MG PO CPDR   Oral   Take 30 mg by mouth every evening.         Marland Kitchen LEVALBUTEROL HCL 0.63 MG/3ML IN NEBU   Nebulization   Take 1 ampule by nebulization 2 (two) times daily. DX 496, 427.31         . LEVOTHYROXINE SODIUM 75 MCG PO TABS   Oral   Take 75 mcg by mouth daily.         Marland Kitchen LORATADINE 10 MG PO TABS   Oral   Take 10 mg by mouth daily.         . ADULT MULTIVITAMIN W/MINERALS CH   Oral   Take 1 tablet by mouth every evening.         . NON FORMULARY      Oxygen  ---- 3 Liters 24 hours continuous         . POLYETHYL GLYCOL-PROPYL GLYCOL 0.4-0.3 % OP SOLN   Ophthalmic   Apply 1 drop to eye daily as needed. For dry eyes         . SIMVASTATIN 80 MG PO TABS   Oral   Take 80 mg by mouth every evening.         Marland Kitchen SALINE NASAL SPRAY 0.65 % NA SOLN  Nasal   Place 1 spray into the nose as needed. For  congestion         . WARFARIN SODIUM 6 MG PO TABS      6 mg as directed. Take everyday except mondays         . BENEFIBER PO   Oral   Take 1 tablet by mouth daily.           BP 154/60  Pulse 81  Temp 98.1 F (36.7 C) (Oral)  Resp 17  SpO2 97%  Physical Exam  Nursing note and vitals reviewed. Constitutional: He is oriented to person, place, and time. He appears well-developed and well-nourished. No distress.  HENT:  Head: Normocephalic and atraumatic.    Right Ear: Tympanic membrane, external ear and ear canal normal.  Left Ear: Tympanic membrane, external ear and ear canal normal.  Nose:    Mouth/Throat: Uvula is midline, oropharynx is clear and moist and mucous membranes are normal. Mucous membranes are not dry and not cyanotic. No oropharyngeal exudate.  Eyes: Conjunctivae normal are normal. No scleral icterus.  Neck: Normal range of motion. Neck supple.  Cardiovascular: Normal rate, normal heart sounds and intact distal pulses.  Exam reveals no gallop and no friction rub.   No murmur heard. Pulmonary/Chest: Effort normal and breath sounds normal. No respiratory distress. He has no wheezes. He has no rales. He exhibits no tenderness.  Abdominal: Soft. Bowel sounds are normal. He exhibits no distension and no mass. There is no tenderness. There is no rebound and no guarding.  Musculoskeletal: Normal range of motion. He exhibits no edema and no tenderness.  Lymphadenopathy:    He has no cervical adenopathy.  Neurological: He is alert and oriented to person, place, and time. He exhibits normal muscle tone. Coordination normal.       Speech is clear and goal oriented Moves extremities without ataxia  Skin: Skin is warm and dry. He is not diaphoretic. Erythema: significant.       Abrasions to the face, 4 and nose Swelling to the nose and poor head Significant ecchymosis and swelling around the eyes  Psychiatric: He has a normal mood and affect.    ED Course    Procedures (including critical care time)  Labs Reviewed - No data to display Ct Head Wo Contrast  09/22/2012  *RADIOLOGY REPORT*  Clinical Data:  Fall with facial pain and bruising.  CT HEAD WITHOUT CONTRAST CT MAXILLOFACIAL WITHOUT CONTRAST CT CERVICAL SPINE WITHOUT CONTRAST  Technique:  Multidetector CT imaging of the head, cervical spine, and maxillofacial structures were performed using the standard protocol without intravenous contrast. Multiplanar CT image reconstructions of the cervical spine and maxillofacial structures were also generated.  Comparison:  Head CT from 05/09/2012 and cervical spine CT from 04/05/2012 the  CT HEAD  Findings: There is no evidence for acute hemorrhage, hydrocephalus, mass lesion, or abnormal extra-axial fluid collection.  No definite CT evidence for acute infarction.  Diffuse loss of parenchymal volume is consistent with atrophy. Patchy low attenuation in the deep hemispheric and periventricular white matter is nonspecific, but likely reflects chronic microvascular ischemic demyelination. The visualized paranasal sinuses and mastoid air cells are clear.  IMPRESSION: No acute intracranial abnormality.  CT MAXILLOFACIAL  Findings:  The mandible is intact.  The temporomandibular joints are located.  No evidence for nasal bone fracture.  The medial and inferior orbital walls are intact.  Orbital rims are intact.  No evidence for frontal bone fracture.  No air-fluid  levels in the paranasal sinuses.  The globes are spherical in shape and symmetric in size.  The intraorbital fat is preserved bilaterally.  There is some soft tissue swelling over the right paramidline frontal scalp and over the right orbit.  IMPRESSION: No evidence for facial bone fracture.  CT CERVICAL SPINE  Findings:   Imaging was obtained from the skull base through the T2 vertebral body.  There is no cervical spine fracture.  No subluxation.  Loss of disc height is most prominent at C4-5 with degenerative  disc changes also seen at C3-4 and C5-6.  The facets are well-aligned bilaterally.  There is no prevertebral soft tissue swelling.  The normal cervical lordosis is preserved.  IMPRESSION:  No evidence for cervical spine fracture.   Original Report Authenticated By: Kennith Center, M.D.    Ct Cervical Spine Wo Contrast  09/22/2012  *RADIOLOGY REPORT*  Clinical Data:  Fall with facial pain and bruising.  CT HEAD WITHOUT CONTRAST CT MAXILLOFACIAL WITHOUT CONTRAST CT CERVICAL SPINE WITHOUT CONTRAST  Technique:  Multidetector CT imaging of the head, cervical spine, and maxillofacial structures were performed using the standard protocol without intravenous contrast. Multiplanar CT image reconstructions of the cervical spine and maxillofacial structures were also generated.  Comparison:  Head CT from 05/09/2012 and cervical spine CT from 04/05/2012 the  CT HEAD  Findings: There is no evidence for acute hemorrhage, hydrocephalus, mass lesion, or abnormal extra-axial fluid collection.  No definite CT evidence for acute infarction.  Diffuse loss of parenchymal volume is consistent with atrophy. Patchy low attenuation in the deep hemispheric and periventricular white matter is nonspecific, but likely reflects chronic microvascular ischemic demyelination. The visualized paranasal sinuses and mastoid air cells are clear.  IMPRESSION: No acute intracranial abnormality.  CT MAXILLOFACIAL  Findings:  The mandible is intact.  The temporomandibular joints are located.  No evidence for nasal bone fracture.  The medial and inferior orbital walls are intact.  Orbital rims are intact.  No evidence for frontal bone fracture.  No air-fluid levels in the paranasal sinuses.  The globes are spherical in shape and symmetric in size.  The intraorbital fat is preserved bilaterally.  There is some soft tissue swelling over the right paramidline frontal scalp and over the right orbit.  IMPRESSION: No evidence for facial bone fracture.  CT  CERVICAL SPINE  Findings:   Imaging was obtained from the skull base through the T2 vertebral body.  There is no cervical spine fracture.  No subluxation.  Loss of disc height is most prominent at C4-5 with degenerative disc changes also seen at C3-4 and C5-6.  The facets are well-aligned bilaterally.  There is no prevertebral soft tissue swelling.  The normal cervical lordosis is preserved.  IMPRESSION:  No evidence for cervical spine fracture.   Original Report Authenticated By: Kennith Center, M.D.    Ct Maxillofacial Wo Cm  09/22/2012  *RADIOLOGY REPORT*  Clinical Data:  Fall with facial pain and bruising.  CT HEAD WITHOUT CONTRAST CT MAXILLOFACIAL WITHOUT CONTRAST CT CERVICAL SPINE WITHOUT CONTRAST  Technique:  Multidetector CT imaging of the head, cervical spine, and maxillofacial structures were performed using the standard protocol without intravenous contrast. Multiplanar CT image reconstructions of the cervical spine and maxillofacial structures were also generated.  Comparison:  Head CT from 05/09/2012 and cervical spine CT from 04/05/2012 the  CT HEAD  Findings: There is no evidence for acute hemorrhage, hydrocephalus, mass lesion, or abnormal extra-axial fluid collection.  No definite CT evidence for  acute infarction.  Diffuse loss of parenchymal volume is consistent with atrophy. Patchy low attenuation in the deep hemispheric and periventricular white matter is nonspecific, but likely reflects chronic microvascular ischemic demyelination. The visualized paranasal sinuses and mastoid air cells are clear.  IMPRESSION: No acute intracranial abnormality.  CT MAXILLOFACIAL  Findings:  The mandible is intact.  The temporomandibular joints are located.  No evidence for nasal bone fracture.  The medial and inferior orbital walls are intact.  Orbital rims are intact.  No evidence for frontal bone fracture.  No air-fluid levels in the paranasal sinuses.  The globes are spherical in shape and symmetric in size.   The intraorbital fat is preserved bilaterally.  There is some soft tissue swelling over the right paramidline frontal scalp and over the right orbit.  IMPRESSION: No evidence for facial bone fracture.  CT CERVICAL SPINE  Findings:   Imaging was obtained from the skull base through the T2 vertebral body.  There is no cervical spine fracture.  No subluxation.  Loss of disc height is most prominent at C4-5 with degenerative disc changes also seen at C3-4 and C5-6.  The facets are well-aligned bilaterally.  There is no prevertebral soft tissue swelling.  The normal cervical lordosis is preserved.  IMPRESSION:  No evidence for cervical spine fracture.   Original Report Authenticated By: Kennith Center, M.D.      1. Fall   2. Hematoma       MDM  IBAN UTZ presents after a mechanical fall.  No concern for syncopal episode. Patient seen by orthopedics this morning for left wrist pain and recommended evaluation of the head since patient is elderly, on Coumadin and hit his head on the fall. Patient has racoon eyes and concern exist for basal skull fracture; patient does not have battle signs. Pt is neurologically intact.  Pt PT/INR is monitored regularly and he has not had any supra therapeutic values recently.   CT of head, neck and maxillofacial is negative for fracture or intracranial pathology.  Pt is ambulatory without difficulty, remains neurovascularly intact, alert and oriented.  Pt and family counseled on supportive treatment.  I have also discussed reasons to return immediately to the ER.  Patient expresses understanding and agrees with plan.  Dr. Gwyneth Sprout was consulted, evaluated this patient with me and agrees with the plan.     1. Medications: usual home medications 2. Treatment: rest, drink plenty of fluids 3. Follow Up: Please followup with your primary doctor for discussion of your diagnoses and further evaluation after today's visit; followup with the orthopedist about left  wrist injury       Dierdre Forth, PA-C 09/22/12 2000  Dahlia Client Jahmya Onofrio, PA-C 09/22/12 2001

## 2012-09-28 ENCOUNTER — Telehealth: Payer: Self-pay | Admitting: Cardiology

## 2012-09-28 NOTE — Telephone Encounter (Signed)
Spoke to patient's wife she stated she wanted Dr.Jordan to review patient's labs he had done 09/17/12 at Dr.Shultz's office.Also she mentioned patient fell on steps this past Friday and has 2 black eyes and lump on his head.Stated she took patient to the urgent care in [place],while talking to wife was ask if a INR was done since patient takes coumadin.She stated she did not think a INR was checked.Advised to call Dr.Shultz's today to have patient's INR checked.Dr.Shultz's office called 09/17/12 lab work was requested.Also was told patient's wife will be calling to to schedule appointment for INR today,was told patient fell this past Friday and a INR was not checked.

## 2012-09-28 NOTE — Telephone Encounter (Signed)
New Problem:    Patient's wife would like D.r Swaziland to review his labs form 09/17/12.  Please call back if you have any questions.

## 2012-09-30 ENCOUNTER — Ambulatory Visit: Payer: Medicare Other | Admitting: Cardiology

## 2012-10-15 ENCOUNTER — Encounter: Payer: Self-pay | Admitting: Internal Medicine

## 2012-10-15 ENCOUNTER — Ambulatory Visit (INDEPENDENT_AMBULATORY_CARE_PROVIDER_SITE_OTHER): Payer: Medicare Other | Admitting: Internal Medicine

## 2012-10-15 VITALS — BP 130/80 | HR 80 | Temp 98.0°F | Ht 67.0 in | Wt 186.6 lb

## 2012-10-15 DIAGNOSIS — Z9181 History of falling: Secondary | ICD-10-CM

## 2012-10-15 DIAGNOSIS — R0609 Other forms of dyspnea: Secondary | ICD-10-CM

## 2012-10-15 DIAGNOSIS — R0600 Dyspnea, unspecified: Secondary | ICD-10-CM

## 2012-10-15 DIAGNOSIS — R296 Repeated falls: Secondary | ICD-10-CM | POA: Insufficient documentation

## 2012-10-15 NOTE — Assessment & Plan Note (Signed)
I am not sure we can  improved shortness of breath any better in terms of treating lung problems beyond using a nebulizer and doing pulmonary rehabilitation You can mix xopenex and atrovent and for convenience you can just do it twice a day You are not supposed to mix pulmicort with above but lot of people do that Please return to see me in 9 months or sooner if needed Continue rehab

## 2012-10-15 NOTE — Patient Instructions (Addendum)
I am not sure we can  improved shortness of breath any better in terms of treating lung problems beyond using a nebulizer and doing pulmonary rehabilitation You can mix xopenex and atrovent and for convenience you can just do it twice a day You are not supposed to mix pulmicort with above but lot of people do that Please return to see me in 9 months or sooner if needed Continue rehab You should talk to primary care physician and also to Dr. Sw[place] about whether it is safe to continue Coumadin anymore I will let them know

## 2012-10-15 NOTE — Assessment & Plan Note (Addendum)
He has had 3 falls in the last 1 year. Most recently fell a few weeks ago and has badly bruised his face because of Coumadin. This is the first fall after starting Coumadin. Not so sure that he should continue with Coumadin particularly with frequent falls in the past 1 year. I will or Dr. [place] and his primary care physician Paulina Fusi, MD  know about this. I have coomunicated this with SCHULTZ,DOUGLAS E, MD about this

## 2012-10-15 NOTE — Progress Notes (Signed)
Subjective:    Patient ID: BLESS BELSHE, male    DOB: [birth year], 79 y.o.   MRN: [id number]  HPI PCP is IONGEXB,MWUXLKG E, MD Cards is Dr Swaziland  Body mass index is 28.82 kg/(m^2).   reports that he quit smoking about 60 years ago. His smoking use included Cigarettes. He has a 25 pack-year smoking history.   IOV 07/03/2012 Presents with wife and daughter [name]  79 year old male. Hx is not great and he has complex medical problems.   Reportedly diagnosed with post OP PE at [facility] of prostate cancer surgery 10 years ago and since then on O2 dependent (was only on short term coumadin only and per record review has filter in place). Says at baseline a year ago he was able to, with 3L O2, able to mow yard do all ADLs. Then in MAy 2013 sustained rt elbow fracture. Not hospitalized. Since then progressive worsening of dyspnea on exertion. Record review shows 01/24/12 MUGA ef 39% but no no stress ischemia. Then in august and sept 2013 hospitlaized for stroke. Found to have a Fib. Started on coumadin. Apparently was tachycardic and all nebs stopped. Has had progresive dyspnea. Per their report Dr Swaziland has cleared his cardiac status as optimized as much as possible. Currently even changing clothes makes him dyspneic. Says last week when he came out of grocery store he was bradycardic to HR 30 on finger monitor but pulse ox on 3L was in 90s (at office retest we think his  A Fib is prventing good pick up of Heart rate)  Walking desaturation test in office:  At 185 feet x 3 laps on 3L o2 - lowest pulse ox was only 89% and Peak HR was 141 (resting HR was 108)   Of note, he specfically denies a diagnosis of copd, Says o2 need is due to old PE. But he was on nebs. Last PFTs was several years ago and he does not rememver details    REC   CT Scan 2000 REPORT ONLY  - CLINICAL DATA: THE PATIENT HAS ASBESTOS EXPOSURE. EVALUATE FOR INTERSTITIAL LUNG DISEASE.  CT SCAN OF THE CHEST WITHOUT CONTRAST BUT WITH  HIGH RESOLUTION SCANS:  A SERIES OF 8 X 12 MM SCANS THROUGHOUT THE CHEST ARE MADE AND COMPARED TO THE PREVIOUS STUDIES OF  06/20/99 FROM [facility], [place], [place], AND SHOW AGAIN THE PROMINENCE OF THE  PERIBRONCHIAL AND INTERSTITIAL CHANGES ASSOCIATED WITH THE LEFT LINGULA AND LEFT LOWER LOBE.  THERE IS NO CONSOLIDATION, PLEURAL EFFUSION, OR PNEUMOTHORAX. NO PLEURAL PLAQUE FORMATION OR  CALCIFICATION IS SEEN. THERE IS NO EVIDENCE OF NEOPLASM. THE TRACHEOBRONCHIAL TREE APPEARS  WITHIN NORMAL LIMITS.  HIGH RESOLUTION SCANS MADE AT 1 MM INTERVALS SHOW SOME VOLUME LOSS IN THE REGION OF THE LEFT  LINGULA AND THE LEFT BASE WITH SOME INTERSTITIAL AND PERIBRONCHIAL FIBROSIS AND SCARRING. THERE  IS NO EVIDENCE OF PLEURAL PLAQUE OR CALCIFICATION. NO EVIDENCE OF NEOPLASM IS SEEN.   CXR 05/09/2012 RADIOLOGY REPORT*  Clinical Data: Pain. Speaking unclearly this morning.  CHEST - 2 VIEW  Comparison: 03/31/2012  Findings: The heart is enlarged. There are no focal consolidations  or pleural effusions. No edema. The patient has an inferior vena  cava filter. Surgical clips are identified in the right upper  quadrant of the abdomen.  IMPRESSION:  1. Cardiomegaly.  2.No evidence for acute pulmonary abnormality.  Original Report Authenticated By: Patterson Hammersmith, M.D.  THERE IS AGAIN NOTED WHAT APPEARS TO BE A  SIMPLE CYST ASSOCIATED WITH THE RIGHT KIDNEY WHICH  MEASURES 6.6 X 7.0 CM AND HAS A DENSITY COMPATIBLE WITH A SIMPLE CYST. THE HEART IS MINIMALLY  PROMINENT IN SIZE.  IMPRESSION  PERIBRONCHIAL AND INTERSTITIAL FIBROSIS AND SCARRING ASSOCIATED WITH THE LEFT LINGULA AND LEFT LUNG  BASE WITH SOME DECREASE IN LUNG VOLUME. THERE IS NO PLEURAL THICKENING OR CALCIFICATION. THERE  IS NO EVIDENCE OF NEOPLASM. THERE IS A LARGE SIMPLE CYST OF THE RIGHT KIDNEY.  REC I think you are having a fitness issue called deconditioning making your shortness of breath worse  Please start pulmonary  rehab at [facility] hospital, referral made - I will get clearance for this from Dr Swaziland and will let you know next week  - any delay starting call us soon  Start xopenex low dose neb 4 times daily, atrovent neb 4 times daily and pulmicort neb two times daily along with xopenex as needed; 90 day supply  - if expensive call us immediately  Followup 6 weeks or sooner if needed    OV 08/14/2012 Fu dyspnea - multifactorial (COPD v klyphosis, CHF, Deconditioning)   No change in dyspnea - still persistent and still bad; even changing clothes makes him dyspneic. Dyspnea associated with fatigue. Says rehab has made him more supple but not helping dspnea. He is compliant iwht xopenex and atroven r4 times daily + pulmicort bid nebs. However, he and wife and he dooes not think it is helping. Also, nebs impacting lifestyle and wants to know if they can be mixed. Of note, he has gained 8# since last visit per hx and he says this is all fluid weight. He has an upcoming appt with Dr Swaziland who has confided in me patient has hx of high salt intake and poor dietary control  Spirooemtry office today - fev1 1.9L/69%, Ratio 75 - c/w restriction (he has kyphosis)    REC I am not sure why you are not better  You can mix xopenex and atrovent and for convenience you can just do it twice a day  You are not supposed to mix pulmicort with above but lot of people do that  I have writtend to Dr Swaziland to address heart issues  Please return to see me in 2 months or sooner if needed  Continue rehab   OV 10/15/2012   followup COPD and dyspnea. Overall dyspnea is no better. He was doing well with pulmonary rehabilitation but it was only helping his fatigue and motor strength and so he was satisfied about it. He is now using Xopenex with Atrovent and Pulmicort. However he does not feel any benefit from these so he just uses it as needed. In fact his CAT score is baseline at 23. I've expressed to him that it'll be very  difficult to improve his dyspnea any further. Of single or note is the fact that he fell down and has bruised his face pretty bad because of his Coumadin intake. I noted that he has fallen 3 times in the last 6 months due to loss of balance and in one of those he fractured his right forearm  CAT COPD Symptom & Quality of Life Score (GSK trademark) 0 is no burden. 5 is highest burden 07/03/2012  08/14/2012  10/15/2012   Never Cough -> Cough all the time 1 1 1   No phlegm in chest -> Chest is full of phlegm 0 0 0  No chest tightness -> Chest feels very tight 3 2 0  No dyspnea  for 1 flight stairs/hill -> Very dyspneic for 1 flight of stairs 5 5 5   No limitations for ADL at home -> Very limited with ADL at home 5 5 5   Confident leaving home -> Not at all confident leaving home 0 0 2  Sleep soundly -> Do not sleep soundly because of lung condition 1 0 5  Lots of Energy -> No energy at all 5 4 5   TOTAL Score (max 40)  20 17 23      Review of Systems  Constitutional: Negative for fever and unexpected weight change.  HENT: Negative for ear pain, nosebleeds, congestion, sore throat, rhinorrhea, sneezing, trouble swallowing, dental problem, postnasal drip and sinus pressure.   Eyes: Negative for redness and itching.  Respiratory: Negative for cough, chest tightness, shortness of breath and wheezing.   Cardiovascular: Negative for palpitations and leg swelling.  Gastrointestinal: Negative for nausea and vomiting.  Genitourinary: Negative for dysuria.  Musculoskeletal: Negative for joint swelling.  Skin: Negative for rash.  Neurological: Negative for headaches.  Hematological: Does not bruise/bleed easily.  Psychiatric/Behavioral: Negative for dysphoric mood. The patient is not nervous/anxious.        Objective:   Physical Exam Nursing note and vitals reviewed. Constitutional: He is oriented to person, place, and time. He appears well-developed and well-nourished. No distress.        ------------------------------   Filed Vitals:   08/14/12 1401  BP: 132/72  Pulse: 79  Temp: 98.1 F (36.7 C)  TempSrc: Oral  Height: 5\' 7"  (1.702 m)  Weight: 191 lb 12.8 oz (87 kg)  SpO2: 95%    HENT:  Head: Normocephalic and atraumatic. Face looks badly bruised  Right Ear: External ear normal.  Left Ear: External ear normal.  Mouth/Throat: Oropharynx is clear and moist. No oropharyngeal exudate.       Deconditioned looking  Eyes: Conjunctivae normal and EOM are normal. Pupils are equal, round, and reactive to light. Right eye exhibits no discharge. Left eye exhibits no discharge. No scleral icterus.  Neck: Normal range of motion. Neck supple. No JVD present. No tracheal deviation present. No thyromegaly present.  Cardiovascular: Normal rate, regular rhythm and intact distal pulses.  Exam reveals no gallop and no friction rub.   No murmur heard. Pulmonary/Chest: Effort normal and breath sounds normal. No respiratory distress. He has no wheezes. He has no rales. He exhibits no tenderness.  Abdominal: Soft. Bowel sounds are normal. He exhibits no distension and no mass. There is no tenderness. There is no rebound and no guarding.  Musculoskeletal: Normal range of motion. He exhibits no edema and no tenderness.  Lymphadenopathy:    He has no cervical adenopathy.  Neurological: He is alert and oriented to person, place, and time. He has normal reflexes. No cranial nerve deficit. Coordination normal.       Slow gait  Skin: Skin is warm and dry. No rash noted. He is not diaphoretic. No erythema. No pallor.  Psychiatric: He has a normal mood and affect. His behavior is normal. Judgment and thought content normal.       Good historian. Cognitively sharp           Assessment & Plan:

## 2012-11-04 ENCOUNTER — Ambulatory Visit: Payer: No Typology Code available for payment source | Admitting: Cardiology

## 2013-01-14 ENCOUNTER — Ambulatory Visit (INDEPENDENT_AMBULATORY_CARE_PROVIDER_SITE_OTHER): Payer: Medicare Other | Admitting: Cardiology

## 2013-01-14 ENCOUNTER — Encounter: Payer: Self-pay | Admitting: Cardiology

## 2013-01-14 VITALS — BP 130/62 | HR 87 | Ht 67.0 in | Wt 182.1 lb

## 2013-01-14 DIAGNOSIS — I2789 Other specified pulmonary heart diseases: Secondary | ICD-10-CM

## 2013-01-14 DIAGNOSIS — I509 Heart failure, unspecified: Secondary | ICD-10-CM

## 2013-01-14 DIAGNOSIS — I10 Essential (primary) hypertension: Secondary | ICD-10-CM

## 2013-01-14 DIAGNOSIS — I4891 Unspecified atrial fibrillation: Secondary | ICD-10-CM

## 2013-01-14 DIAGNOSIS — I272 Pulmonary hypertension, unspecified: Secondary | ICD-10-CM

## 2013-01-14 DIAGNOSIS — I5042 Chronic combined systolic (congestive) and diastolic (congestive) heart failure: Secondary | ICD-10-CM

## 2013-01-14 DIAGNOSIS — I2699 Other pulmonary embolism without acute cor pulmonale: Secondary | ICD-10-CM

## 2013-01-14 NOTE — Progress Notes (Signed)
Manuel [name] Date of Birth: 05/21/1922 Medical Record #[id number]  History of Present Illness: Manuel [name] is seen back today for a followup visit. He has multiple medical issues which include CHF, COPD, chronic atrial fib, past GI bleed, recent CVA, remote PE with a vena cava filter in place, chronic oxygen therapy, COPD, CKD  and advanced age. An echocardiogram this past year showed an EF of 40 to 45%. Nuclear stress test showed  no ischemia and EF was 39%. He still complains of dyspnea which is chronic. He is taking 80 mg of Lasix daily with an extra Lasix in the evening if his weight increases. He does complain of chronic fatigue. He is using oxygen continuously. His weight is actually down 2 pounds on this visit.  Current Outpatient Prescriptions on File Prior to Visit  Medication Sig Dispense Refill  . acetaminophen (TYLENOL) 500 MG tablet Take 1,000 mg by mouth every 6 (six) hours as needed. pain      . carvedilol (COREG) 6.25 MG tablet Take 1 tablet (6.25 mg total) by mouth 2 (two) times daily.  180 tablet  3  . cyanocobalamin (,VITAMIN B-12,) 1000 MCG/ML injection Inject 1,000 mcg into the muscle every 30 (thirty) days.      . digoxin (LANOXIN) 0.125 MG tablet Take one tablet today and tomorrow, then just 1/2 tablet daily  90 tablet  3  . febuxostat (ULORIC) 40 MG tablet Take 40 mg by mouth every evening.       . fish oil-omega-3 fatty acids 1000 MG capsule Take 1 g by mouth daily.      . furosemide (LASIX) 80 MG tablet Take 80 mg by mouth daily.      Marland Kitchen ipratropium (ATROVENT) 0.02 % nebulizer solution Take 500 mcg by nebulization 2 (two) times daily.      . lansoprazole (PREVACID) 30 MG capsule Take 30 mg by mouth every evening.      . levalbuterol (XOPENEX) 0.63 MG/3ML nebulizer solution Take 1 ampule by nebulization 2 (two) times daily. DX 496, 427.31      . levothyroxine (SYNTHROID, LEVOTHROID) 75 MCG tablet Take 75 mcg by mouth daily.      Marland Kitchen loratadine (CLARITIN) 10 MG tablet Take 10  mg by mouth daily.      . Multiple Vitamin (MULTIVITAMIN WITH MINERALS) TABS Take 1 tablet by mouth every evening.      . NON FORMULARY Oxygen  ---- 3 Liters 24 hours continuous      . Polyethyl Glycol-Propyl Glycol (SYSTANE) 0.4-0.3 % SOLN Apply 1 drop to eye daily as needed. For dry eyes      . simvastatin (ZOCOR) 80 MG tablet Take 80 mg by mouth every evening.      . sodium chloride (OCEAN) 0.65 % nasal spray Place 1 spray into the nose as needed. For congestion      . warfarin (COUMADIN) 6 MG tablet 6 mg as directed. Take everyday except mondays      . Wheat Dextrin (BENEFIBER PO) Take 1 tablet by mouth daily.       No current facility-administered medications on file prior to visit.    Allergies  Allergen Reactions  . Albuterol Sulfate     palpatations  . Morphine And Related Nausea And Vomiting    Past Medical History  Diagnosis Date  . Pulmonary embolism 2003  . COPD (chronic obstructive pulmonary disease)     on home O2  . Hypertension   . Atrial fibrillation   .  Hyperlipidemia   . Prostate cancer   . Colon polyps   . Ulnar fracture   . CVA (cerebral infarction)   . CKD (chronic kidney disease) stage 3, GFR 30-59 ml/min   . Hypothyroidism   . Stroke     Past Surgical History  Procedure Laterality Date  . Tee without cardioversion  05/11/2012    Procedure: TRANSESOPHAGEAL ECHOCARDIOGRAM (TEE);  Surgeon: Lewayne Bunting, MD;  Location: [facility] ENDOSCOPY;  Service: Cardiovascular;  Laterality: N/A;  Rm 4N21  . Prostatectomy    . Cholecystectomy    . Appendectomy    . Partial colectomy    . Cataract extraction      bilateral  . Total hip arthroplasty      bilateral  . Splenectomy    . Ivc filter      History  Smoking status  . Former Smoker -- 1.00 packs/day for 25 years  . Types: Cigarettes  . Quit date: 09/24/1951  Smokeless tobacco  . Never Used    History  Alcohol Use No    Family History  Problem Relation Age of Onset  . Heart disease Father   .  Ovarian cancer      Review of Systems: The review of systems is per the HPI.   All other systems were reviewed and are negative.  Physical Exam: BP 130/62  Pulse 87  Ht 5\' 7"  (1.702 m)  Wt 182 lb 1.9 oz (82.609 kg)  BMI 28.52 kg/m2  SpO2 93% Patient is a pleasant elderly white male in no acute distress. He does look chronically ill.  Skin is warm and dry. Color is normal.  HEENT is unremarkable. Normocephalic/atraumatic. PERRL. Sclera are nonicteric. Neck is supple. No masses. No JVD. Lungs are fairly clear. Oxygen is in place. Cardiac exam shows an irregular rhythm. Abdomen is soft. Extremities are with trace edema. He has his support stockings on today. Gait and ROM are intact. No gross neurologic deficits noted.  LABORATORY DATA:  I reviewed his complete laboratory data from Dr. Tomasa Blase on April 9. His creatinine is 1.5. Electrolytes are normal. Hemoglobin was 11.1. Cholesterol levels were at goal. LFTs were normal.   Assessment / Plan:  1. Systolic heart failure - EF is 40 to 45%.  He is on beta blocker and ACE.  He remains short of breath which is felt to be multifactorial (from atrial fib/COPD/CHF). Sodium intake is a chronic issue. He continues to eat out daily. We will continue his current diuretic therapy.  2. Chronic atrial fib - rate control appears to be adequate on digoxin and Coreg.   3. Chronic coumadin - followed by his PCP  4. Advanced age  27. status post CVA - on anticoagulation  6. Remote PE - has a filter in place  7. COPD with restrictive lung disease.  8. Moderate pulmonary hypertension by echocardiogram. This is most likely related to his chronic heart failure and pulmonary disease.   I think we are doing about as well as possible for Mr. Oestreicher from his breathing standpoint. I've reinforced the need to restrict his sodium intake. Continue oxygen therapy. I'll followup again in 4 months.

## 2013-01-14 NOTE — Patient Instructions (Signed)
Continue your current medication.  Try and restrict your salt intake.  I will see you in 4 months.

## 2013-04-09 ENCOUNTER — Encounter: Payer: Self-pay | Admitting: Cardiology

## 2013-04-30 ENCOUNTER — Encounter: Payer: Self-pay | Admitting: Cardiology

## 2013-04-30 ENCOUNTER — Ambulatory Visit (INDEPENDENT_AMBULATORY_CARE_PROVIDER_SITE_OTHER): Payer: Medicare Other | Admitting: Cardiology

## 2013-04-30 VITALS — BP 140/78 | HR 62 | Ht 67.0 in | Wt 176.0 lb

## 2013-04-30 DIAGNOSIS — I4891 Unspecified atrial fibrillation: Secondary | ICD-10-CM

## 2013-04-30 DIAGNOSIS — I5042 Chronic combined systolic (congestive) and diastolic (congestive) heart failure: Secondary | ICD-10-CM

## 2013-04-30 DIAGNOSIS — E785 Hyperlipidemia, unspecified: Secondary | ICD-10-CM

## 2013-04-30 DIAGNOSIS — I509 Heart failure, unspecified: Secondary | ICD-10-CM

## 2013-04-30 NOTE — Patient Instructions (Signed)
Continue your current therapy  I will see you in 4 months  

## 2013-04-30 NOTE — Progress Notes (Signed)
[name] Date of Birth: [birth year] Medical Record #[id number]  History of Present Illness: Manuel [name] is seen back today for a followup visit. He has multiple medical issues which include CHF, COPD, chronic atrial fib, past GI bleed,  CVA, remote PE with a vena cava filter in place, chronic oxygen therapy, COPD, CKD  and advanced age.  EF is 40 to 45%. Nuclear stress test showed  no ischemia and EF was 39%. He reports chronic dyspnea with activity. This is about the same. He denies orthopnea or PND. He still tries to do some light yard work. He also has to exercise on a stationary bike and treadmill but reports his oxygen level drops really low with exercise. He has lost 6 pounds since his last visit. He has no increase in edema. He reports that it is been very difficult to regulate his Coumadin and he is being considered for one of the novel anticoagulants.   Current Outpatient Prescriptions on File Prior to Visit  Medication Sig Dispense Refill  . acetaminophen (TYLENOL) 500 MG tablet Take 1,000 mg by mouth every 6 (six) hours as needed. pain      . carvedilol (COREG) 6.25 MG tablet Take 1 tablet (6.25 mg total) by mouth 2 (two) times daily.  180 tablet  3  . cyanocobalamin (,VITAMIN B-12,) 1000 MCG/ML injection Inject 1,000 mcg into the muscle every 30 (thirty) days.      . digoxin (LANOXIN) 0.125 MG tablet Take one tablet today and tomorrow, then just 1/2 tablet daily  90 tablet  3  . febuxostat (ULORIC) 40 MG tablet Take 40 mg by mouth every evening.       . fish oil-omega-3 fatty acids 1000 MG capsule Take 1 g by mouth daily.      . furosemide (LASIX) 80 MG tablet Take 80 mg by mouth daily.      . lansoprazole (PREVACID) 30 MG capsule Take 30 mg by mouth every evening.      Marland Kitchen levothyroxine (SYNTHROID, LEVOTHROID) 75 MCG tablet Take 75 mcg by mouth daily.      Marland Kitchen loratadine (CLARITIN) 10 MG tablet Take 10 mg by mouth daily.      . Multiple Vitamin (MULTIVITAMIN WITH MINERALS) TABS Take 1  tablet by mouth every evening.      . NON FORMULARY Oxygen  ---- 3 Liters 24 hours continuous      . Polyethyl Glycol-Propyl Glycol (SYSTANE) 0.4-0.3 % SOLN Apply 1 drop to eye daily as needed. For dry eyes      . simvastatin (ZOCOR) 80 MG tablet Take 80 mg by mouth every evening.      . sodium chloride (OCEAN) 0.65 % nasal spray Place 1 spray into the nose as needed. For congestion      . warfarin (COUMADIN) 6 MG tablet 6 mg as directed. Take everyday except mondays      . Wheat Dextrin (BENEFIBER PO) Take 1 tablet by mouth daily.       No current facility-administered medications on file prior to visit.    Allergies  Allergen Reactions  . Albuterol Sulfate     palpatations  . Morphine And Related Nausea And Vomiting    Past Medical History  Diagnosis Date  . Pulmonary embolism 2003  . COPD (chronic obstructive pulmonary disease)     on home O2  . Hypertension   . Atrial fibrillation   . Hyperlipidemia   . Prostate cancer   . Colon polyps   .  Ulnar fracture   . CVA (cerebral infarction)   . CKD (chronic kidney disease) stage 3, GFR 30-59 ml/min   . Hypothyroidism   . Stroke     Past Surgical History  Procedure Laterality Date  . Tee without cardioversion  05/11/2012    Procedure: TRANSESOPHAGEAL ECHOCARDIOGRAM (TEE);  Surgeon: Lewayne Bunting, MD;  Location: [facility] ENDOSCOPY;  Service: Cardiovascular;  Laterality: N/A;  Rm 4N21  . Prostatectomy    . Cholecystectomy    . Appendectomy    . Partial colectomy    . Cataract extraction      bilateral  . Total hip arthroplasty      bilateral  . Splenectomy    . Ivc filter      History  Smoking status  . Former Smoker -- 1.00 packs/day for 25 years  . Types: Cigarettes  . Quit date: 09/24/1951  Smokeless tobacco  . Never Used    History  Alcohol Use No    Family History  Problem Relation Age of Onset  . Heart disease Father   . Ovarian cancer      Review of Systems: The review of systems is per the HPI.    All other systems were reviewed and are negative.  Physical Exam: BP 140/78  Pulse 62  Ht 5\' 7"  (1.702 m)  Wt 176 lb (79.833 kg)  BMI 27.56 kg/m2  SpO2 94% Patient is a pleasant elderly white male in no acute distress. He does look chronically ill.  Skin is warm and dry. Color is normal.  HEENT is unremarkable. Normocephalic/atraumatic. PERRL. Sclera are nonicteric. Neck is supple. No masses. No JVD. Lungs are fairly clear. Oxygen is in place. Cardiac exam shows an irregular rhythm. Abdomen is soft. Extremities are without edema. No gross neurologic deficits noted.  LABORATORY DATA:  I reviewed his complete laboratory data from Dr. Tomasa Blase on July 18. Hemoglobin was 12.8. Creatinine 1.61. Potassium 4.2. Total cholesterol 114, HDL 32, LDL 50, triglycerides 160. TSH 2.6. Dig level 0.7.   Assessment / Plan:  1. Systolic heart failure - EF is 40 to 45%.  He is on beta blocker and ACE.  He has chronic short of breath which is felt to be multifactorial (from atrial fib/COPD/CHF). Sodium intake is a chronic issue. He has lost 7 pounds since his last visit and has no edema today. We will continue his current diuretic therapy. Overall I feel he is doing fairly well.  2. Chronic atrial fib - rate control appears to be adequate on digoxin and Coreg.   3. Chronic coumadin - followed by his PCP. I am not opposed to switching him to a novel agent. His Coumadin has been difficult to regulate.  4. Advanced age  36. status post CVA - on anticoagulation  6. Remote PE - has a filter in place  7. COPD with restrictive lung disease.  8. Moderate pulmonary hypertension by echocardiogram. This is most likely related to his chronic heart failure and pulmonary disease.

## 2013-06-16 ENCOUNTER — Other Ambulatory Visit: Payer: Self-pay | Admitting: Cardiology

## 2013-07-13 ENCOUNTER — Other Ambulatory Visit: Payer: Self-pay | Admitting: Nurse Practitioner

## 2013-07-29 ENCOUNTER — Other Ambulatory Visit: Payer: Self-pay

## 2013-08-05 ENCOUNTER — Ambulatory Visit: Payer: Medicare Other | Admitting: Internal Medicine

## 2013-08-30 ENCOUNTER — Ambulatory Visit (INDEPENDENT_AMBULATORY_CARE_PROVIDER_SITE_OTHER): Payer: Medicare Other | Admitting: Cardiology

## 2013-08-30 ENCOUNTER — Encounter: Payer: Self-pay | Admitting: Cardiology

## 2013-08-30 VITALS — BP 122/78 | HR 78 | Ht 67.0 in | Wt 174.0 lb

## 2013-08-30 DIAGNOSIS — I4891 Unspecified atrial fibrillation: Secondary | ICD-10-CM

## 2013-08-30 DIAGNOSIS — I5042 Chronic combined systolic (congestive) and diastolic (congestive) heart failure: Secondary | ICD-10-CM

## 2013-08-30 DIAGNOSIS — N189 Chronic kidney disease, unspecified: Secondary | ICD-10-CM

## 2013-08-30 DIAGNOSIS — I509 Heart failure, unspecified: Secondary | ICD-10-CM

## 2013-08-30 DIAGNOSIS — I5022 Chronic systolic (congestive) heart failure: Secondary | ICD-10-CM

## 2013-08-30 DIAGNOSIS — R0609 Other forms of dyspnea: Secondary | ICD-10-CM

## 2013-08-30 DIAGNOSIS — R0600 Dyspnea, unspecified: Secondary | ICD-10-CM

## 2013-08-30 DIAGNOSIS — I10 Essential (primary) hypertension: Secondary | ICD-10-CM

## 2013-08-30 MED ORDER — DIGOXIN 125 MCG PO TABS: ORAL_TABLET | ORAL | Status: DC

## 2013-08-30 NOTE — Patient Instructions (Signed)
Continue your current therapy  I will see you in 6 months.   

## 2013-08-30 NOTE — Progress Notes (Signed)
Manuel [name] Date of Birth: 1921-09-29 Medical Record #[id number]  History of Present Illness: Manuel [name] is seen back today for a followup visit. He has multiple medical issues which include CHF, COPD, chronic atrial fib, past GI bleed,  CVA, remote PE with a vena cava filter in place, chronic oxygen therapy, COPD, CKD  and advanced age.  EF by Echo 12/13  is 40 to 45%. Nuclear stress test 5/13 showed  no ischemia and EF was 39%. He reports chronic dyspnea with activity. This is unchanged. He denies orthopnea or PND.  He has lost 2 pounds since his last visit. He has no increase in edema. He was switched from Coumadin to Xarelto. He does complain of pain in both great toes. He was checked for gout and this was negative.   Current Outpatient Prescriptions on File Prior to Visit  Medication Sig Dispense Refill  . acetaminophen (TYLENOL) 500 MG tablet Take 1,000 mg by mouth every 6 (six) hours as needed. pain      . carvedilol (COREG) 6.25 MG tablet TAKE 1 TABLET BY MOUTH TWICE A DAY  180 tablet  2  . febuxostat (ULORIC) 40 MG tablet Take 40 mg by mouth every evening.       . fish oil-omega-3 fatty acids 1000 MG capsule Take 1 g by mouth daily.      . furosemide (LASIX) 80 MG tablet TAKE 80 MG IN THE AM AND 40 MG IN THE EARLY AFTERNOON  135 tablet  0  . lansoprazole (PREVACID) 30 MG capsule Take 30 mg by mouth every evening.      Marland Kitchen levothyroxine (SYNTHROID, LEVOTHROID) 75 MCG tablet Take 75 mcg by mouth daily.      Marland Kitchen loratadine (CLARITIN) 10 MG tablet Take 10 mg by mouth daily.      . Multiple Vitamin (MULTIVITAMIN WITH MINERALS) TABS Take 1 tablet by mouth every evening.      . NON FORMULARY Oxygen  ---- 3 Liters 24 hours continuous      . Polyethyl Glycol-Propyl Glycol (SYSTANE) 0.4-0.3 % SOLN Apply 1 drop to eye daily as needed. For dry eyes      . simvastatin (ZOCOR) 80 MG tablet Take 80 mg by mouth every evening.      . sodium chloride (OCEAN) 0.65 % nasal spray Place 1 spray into the nose  as needed. For congestion      . Wheat Dextrin (BENEFIBER PO) Take 1 tablet by mouth daily.       No current facility-administered medications on file prior to visit.    Allergies  Allergen Reactions  . Albuterol Sulfate     palpatations  . Morphine And Related Nausea And Vomiting    Past Medical History  Diagnosis Date  . Pulmonary embolism 2003  . COPD (chronic obstructive pulmonary disease)     on home O2  . Hypertension   . Atrial fibrillation   . Hyperlipidemia   . Prostate cancer   . Colon polyps   . Ulnar fracture   . CVA (cerebral infarction)   . CKD (chronic kidney disease) stage 3, GFR 30-59 ml/min   . Hypothyroidism   . Stroke     Past Surgical History  Procedure Laterality Date  . Tee without cardioversion  05/11/2012    Procedure: TRANSESOPHAGEAL ECHOCARDIOGRAM (TEE);  Surgeon: Lewayne Bunting, MD;  Location: [facility] ENDOSCOPY;  Service: Cardiovascular;  Laterality: N/A;  Rm 4N21  . Prostatectomy    . Cholecystectomy    .  Appendectomy    . Partial colectomy    . Cataract extraction      bilateral  . Total hip arthroplasty      bilateral  . Splenectomy    . Ivc filter      History  Smoking status  . Former Smoker -- 1.00 packs/day for 25 years  . Types: Cigarettes  . Quit date: 09/24/1951  Smokeless tobacco  . Never Used    History  Alcohol Use No    Family History  Problem Relation Age of Onset  . Heart disease Father   . Ovarian cancer      Review of Systems: The review of systems is per the HPI.   All other systems were reviewed and are negative.  Physical Exam: BP 122/78  Pulse 78  Ht 5\' 7"  (1.702 m)  Wt 174 lb (78.926 kg)  BMI 27.25 kg/m2 Patient is a pleasant elderly white male in no acute distress. He does look chronically ill.  Skin is warm and dry. Color is normal.  HEENT is unremarkable. Normocephalic/atraumatic. PERRL. Sclera are nonicteric. Neck is supple. No masses. No JVD. Lungs are clear. Oxygen is in place. Cardiac exam  shows an irregular rhythm. Abdomen is soft. Extremities are without edema. No gross neurologic deficits noted.  LABORATORY DATA:  I reviewed his complete laboratory data from Dr. Tomasa Blase on July 18. Hemoglobin was 12.8. Creatinine 1.61. Potassium 4.2. Total cholesterol 114, HDL 32, LDL 50, triglycerides 160. TSH 2.6. Dig level 0.7.  Ecg today shows Afib with rate 78 bpm, RBBB. No change from last year.  Assessment / Plan:  1. Systolic heart failure - EF is 40 to 45%.  He is on beta blocker and ACE.  He has chronic short of breath which is felt to be multifactorial (from atrial fib/COPD/CHF). Sodium intake is a chronic issue but he has done much better this year. We will continue his current diuretic therapy. Overall I feel he is doing fairly well.  2. Chronic atrial fib - rate control appears to be adequate on digoxin and Coreg.   3. Chronic anticoagulation with Xarelto.  4. Advanced age  79. status post CVA - on anticoagulation  6. Remote PE - has a filter in place  7. COPD with restrictive lung disease.  8. Moderate pulmonary hypertension by echocardiogram. This is most likely related to his chronic heart failure and pulmonary disease.

## 2013-09-07 ENCOUNTER — Ambulatory Visit (INDEPENDENT_AMBULATORY_CARE_PROVIDER_SITE_OTHER): Payer: Medicare Other | Admitting: Podiatrist

## 2013-09-07 ENCOUNTER — Ambulatory Visit: Payer: Medicare Other

## 2013-09-07 ENCOUNTER — Encounter: Payer: Self-pay | Admitting: Podiatrist

## 2013-09-07 VITALS — BP 155/96 | HR 84 | Resp 18

## 2013-09-07 DIAGNOSIS — M79609 Pain in unspecified limb: Secondary | ICD-10-CM

## 2013-09-07 DIAGNOSIS — L600 Ingrowing nail: Secondary | ICD-10-CM

## 2013-09-07 NOTE — Progress Notes (Signed)
   Subjective:    Patient ID: Manuel [name], male    DOB: [birth year], 79 y.o.   MRN: [id number]  HPI I have some pain in my big toes on both foot and been going on for about 2 months and shoes did hurt and feels like pin needles and I can feel it at night    Review of Systems  Constitutional: Negative.   HENT: Positive for hearing loss.   Eyes: Negative.   Respiratory:       Difficulty breathing and has oxygen  Cardiovascular: Negative.   Gastrointestinal: Negative.   Endocrine: Negative.   Genitourinary: Negative.   Musculoskeletal:       Difficulty walking  Skin: Negative.   Allergic/Immunologic: Negative.   Neurological: Negative.   Hematological: Bruises/bleeds easily.  Psychiatric/Behavioral: Negative.        Objective:   Physical Exam GENERAL APPEARANCE: Alert, conversant. Appropriately groomed. No acute distress.  VASCULAR: Pedal pulses palpable and strong bilateral.  Capillary refill time is immediate to all digits,  Proximal to distal cooling it warm to warm.  Digital hair growth is present bilateral  NEUROLOGIC: sensation is intact epicritically and protectively to 5.07 monofilament at 5/5 sites bilateral.  Light touch is intact bilateral, vibratory sensation intact bilateral, achilles tendon reflex is intact bilateral.  MUSCULOSKELETAL: acceptable muscle strength, tone and stability bilateral.  Intrinsic muscluature intact bilateral.  Rectus appearance of foot and digits noted bilateral.   DERMATOLOGIC: skin color, texture, and turger are within normal limits.  Painful ingrown great toenails medial and lateral nail border are noted. They grow down into the skin and are painful with pressure. No sign of infection is noted.    Assessment & Plan:  Ingrown toenail bilateral first Plan:Treatment options and alternatives discussed.  Recommended permanent phenol matrixectomy and patient agreed.  bilateral great toes  prepped with alcohol and a 1 to 1 mix of 0.5% marcaine  plain and 2% lidocaine plain was administered in a digital block fashion.  The toes were then prepped with betadine solution and exsanguinated.  The offending nail borders were then excised and matrix tissue exposed.  Phenol was then applied to the matrix tissue followed by an alcohol wash.  Antibiotic ointment and a dry sterile dressing was applied.  The patient was dispensed instructions for aftercare.    Marlowe Aschoff DPM

## 2013-09-07 NOTE — Patient Instructions (Signed)
Soak Instructions      Place 1/4 cup epsom salts in a quart of warm tap water.  Submerge your foot or feet for 20 minutes.   Remove you foot and dry. Apply cream to your skin to prevent it from drying out too much.  You may put neosporin on the edges of your toenails if you like.  If you symptoms do not improve, please let me know.

## 2014-02-28 ENCOUNTER — Ambulatory Visit: Payer: Medicare Other | Admitting: Nurse Practitioner

## 2014-03-23 ENCOUNTER — Encounter: Payer: Self-pay | Admitting: Physician Assistant

## 2014-03-23 ENCOUNTER — Ambulatory Visit (INDEPENDENT_AMBULATORY_CARE_PROVIDER_SITE_OTHER): Payer: Medicare Other | Admitting: Physician Assistant

## 2014-03-23 VITALS — BP 120/58 | HR 91 | Ht 67.0 in | Wt 174.0 lb

## 2014-03-23 DIAGNOSIS — E785 Hyperlipidemia, unspecified: Secondary | ICD-10-CM

## 2014-03-23 DIAGNOSIS — R0602 Shortness of breath: Secondary | ICD-10-CM

## 2014-03-23 DIAGNOSIS — C61 Malignant neoplasm of prostate: Secondary | ICD-10-CM

## 2014-03-23 DIAGNOSIS — I4891 Unspecified atrial fibrillation: Secondary | ICD-10-CM

## 2014-03-23 DIAGNOSIS — E039 Hypothyroidism, unspecified: Secondary | ICD-10-CM

## 2014-03-23 DIAGNOSIS — I5022 Chronic systolic (congestive) heart failure: Secondary | ICD-10-CM

## 2014-03-23 DIAGNOSIS — R079 Chest pain, unspecified: Secondary | ICD-10-CM

## 2014-03-23 DIAGNOSIS — I482 Chronic atrial fibrillation, unspecified: Secondary | ICD-10-CM

## 2014-03-23 MED ORDER — SIMVASTATIN 40 MG PO TABS: 40.0000 mg | ORAL_TABLET | Freq: Every evening | ORAL | Status: DC

## 2014-03-23 MED ORDER — DIGOXIN 125 MCG PO TABS: ORAL_TABLET | ORAL | Status: DC

## 2014-03-23 MED ORDER — ISOSORBIDE MONONITRATE ER 30 MG PO TB24: 30.0000 mg | ORAL_TABLET | Freq: Every day | ORAL | Status: DC

## 2014-03-23 MED ORDER — FUROSEMIDE 80 MG PO TABS: ORAL_TABLET | ORAL | Status: DC

## 2014-03-23 NOTE — Progress Notes (Signed)
Cardiology Office Note    Date:  03/23/2014   ID:  Manuel [name], DOB Apr 03, 1922, MRN [id number]  PCP:  Nicoletta Dress, MD  Cardiologist:  Dr. Peter Martinique      History of Present Illness: Manuel [name] is a 80 y.o. male with a history of chronic atrial fibrillation, dilated cardiomyopathy treated medically, systolic CHF, prior GI bleed on Coumadin, prior stroke, remote pulmonary embolism status post IVC filter, COPD on chronic O2, CKD, prostate CA previously tx with Lupron. EF has been in the 40-45% range. He has multifactorial dyspnea. He is currently on Xarelto for anticoagulation. He was last seen by Dr. Martinique 08/2013.  He returns for follow up. He tells me that he has been feeling as though he is going downhill over the last month. He notes that he has no energy. His breathing is worse. He does note chest heaviness or tightness at times.  This is chronic but seems to be worsening.This may occur at rest as well as with activity. He denies any associated symptoms. He denies orthopnea or PND. He has chronic LE edema without significant change. He denies syncope. Of note, he does also complain of night sweats.   Studies:  - Echo (08/25/12):  EF 40-45%, diffuse HK, mild AI, mild MR, moderate LAE, mild RV, mild RAE, moderate TR, PASP 50 mm Hg  - Nuclear ([facility]):  No ischemia, EF 39%  - Carotid US (04/2012):  Bilateral no significant ICA stenosis   Recent Labs: No results found for requested labs within last 365 days.  Wt Readings from Last 3 Encounters:  03/23/14 174 lb (78.926 kg)  08/30/13 174 lb (78.926 kg)  04/30/13 176 lb (79.833 kg)     Past Medical History  Diagnosis Date  . Pulmonary embolism 2003  . COPD (chronic obstructive pulmonary disease)     on home O2  . Hypertension   . Atrial fibrillation   . Hyperlipidemia   . Prostate cancer   . Colon polyps   . Ulnar fracture   . CVA (cerebral infarction)   . CKD (chronic kidney disease)  stage 3, GFR 30-59 ml/min   . Hypothyroidism   . Stroke     Current Outpatient Prescriptions  Medication Sig Dispense Refill  . acetaminophen (TYLENOL) 500 MG tablet Take 1,000 mg by mouth every 6 (six) hours as needed. pain      . albuterol (2.5 MG/3ML) 0.083% NEBU 3 mL, albuterol (5 MG/ML) 0.5% NEBU 0.5 mL Inhale into the lungs every 6 (six) hours.      . carvedilol (COREG) 6.25 MG tablet TAKE 1 TABLET BY MOUTH TWICE A DAY  180 tablet  2  . digoxin (LANOXIN) 0.125 MG tablet Take one tablet today and tomorrow, then just 1/2 tablet daily  90 tablet  3  . febuxostat (ULORIC) 40 MG tablet Take 40 mg by mouth every evening.       . fish oil-omega-3 fatty acids 1000 MG capsule Take 1 g by mouth daily.      . furosemide (LASIX) 80 MG tablet TAKE 80 MG IN THE AM AND 40 MG IN THE EARLY AFTERNOON  135 tablet  0  . lansoprazole (PREVACID) 30 MG capsule Take 30 mg by mouth every evening.      Marland Kitchen Leuprolide Acetate (LUPRON IJ) Inject as directed. PT  TAKES 1 INJECTION EVERY 6 MONTHS -next one due January 2015      . levothyroxine (SYNTHROID, LEVOTHROID) 75 MCG tablet Take  75 mcg by mouth daily.      Marland Kitchen loratadine (CLARITIN) 10 MG tablet Take 10 mg by mouth daily.      . Multiple Vitamin (MULTIVITAMIN WITH MINERALS) TABS Take 1 tablet by mouth every evening.      . NON FORMULARY Oxygen  ---- 3 Liters 24 hours continuous      . Polyethyl Glycol-Propyl Glycol (SYSTANE) 0.4-0.3 % SOLN Apply 1 drop to eye daily as needed. For dry eyes      . simvastatin (ZOCOR) 80 MG tablet Take 80 mg by mouth every evening.      . sodium chloride (OCEAN) 0.65 % nasal spray Place 1 spray into the nose as needed. For congestion      . vitamin B-12 (CYANOCOBALAMIN) 1000 MCG tablet pT NOT LONGER TAKING INJECTIONS -PT TAKING 1 TAB DAILY      . Wheat Dextrin (BENEFIBER PO) Take 1 tablet by mouth daily.      Alveda Reasons 20 MG TABS tablet 1 TAB DAILY       No current facility-administered medications for this visit.    Allergies:    Albuterol sulfate and Morphine and related   Social History:  The patient  reports that he quit smoking about 62 years ago. His smoking use included Cigarettes. He has a 25 pack-year smoking history. He has never used smokeless tobacco. He reports that he does not drink alcohol or use illicit drugs.   Family History:  The patient's family history includes Heart disease in his father; Ovarian cancer in an other family member.   ROS:  Please see the history of present illness.      All other systems reviewed and negative.   PHYSICAL EXAM: VS:  BP 120/58  Pulse 91  Ht 5\' 7"  (1.702 m)  Wt 174 lb (78.926 kg)  BMI 27.25 kg/m2 Well nourished, well developed, in no acute distress HEENT: normal Neck: I cannot assess JVD Cardiac:  normal S1, S2; irregularly irregular rhythm; 2/6 systolic murmur heard best along the LSB Lungs:  Decreased breath sounds bilaterally, no wheezing, rhonchi or rales Abd: soft, nontender, no hepatomegaly Ext: trace-1+ bilateral LE edema Skin: warm and dry Neuro:  CNs 2-12 intact, no focal abnormalities noted  EKG:  Atrial fibrillation, HR 91, normal axis, RBBB, no change from prior tracing     ASSESSMENT AND PLAN:  1. Shortness of breath:  This is likely multifactorial and related to systolic heart failure, deconditioning, COPD. However, the patient notes that his symptoms are worsened. I suspect he may have a component of worsening volume overload. I will increase his Lasix to 80 mg twice a day for 3 days and then resume 80 mg in the morning and 40 mg in the evening. He also has a dilated cardiomyopathy that has been treated conservatively. He certainly could have underlying CAD. He does have symptoms of chest discomfort as well. We had along discussion today regarding further aggressive workup versus trying to manage him medically. He prefers medical therapy. Therefore, I will avoid proceeding with echocardiogram or stress testing at this time. I will place him on  isosorbide 30 mg daily. He does not take any PDE-5 inhibitors. I will check a basic metabolic panel, CBC, TSH, BNP, LFTs. I will obtain a chest x-ray. He will be brought back in close follow up. 2. Chest pain, unspecified:  Add isosorbide as noted above. 3. Chronic systolic heart failure:  Adjust Lasix as noted. 4. Chronic atrial fibrillation:  Rate controlled. He  remains on Xarelto. 5. Hyperlipidemia:  Decrease simvastatin to 40 mg each bedtime given his advanced age. 6. Hx of Prostate cancer:  He has noted night sweats. Consider follow up with urology if symptoms continue. 7. Hypothyroidism, unspecified hypothyroidism type:  Given recent symptoms, obtain followup TSH. 8. Disposition: Follow up with Dr. Martinique or me in 2-3 weeks.   Signed, Versie Starks, MHS 03/23/2014 4:15 PM    [facility] [address], [place], [place]  [address] Phone: [phone]; Fax: [phone]

## 2014-03-23 NOTE — Patient Instructions (Addendum)
INCREASE LASIX TO 80 MG TWICE DAILY FOR 3 DAYS;  AFTER THE 3 DAYS GO BACK TO LASIX 80 MG IN THE AM AND 40 MG PM  DECREASE SIMVASTATIN 40 MG EVERY NIGHT; NEW RX SENT IN TODAY  LAB WORK TODAY; BMET, CBC W/DIFF, TSH, BNP, LFT  START IMDUR 30 MG DAILY; RX SENT IN TODAY # 36 X 3 SENT TO [facility]  YOU WILL NEED A CHEST X-RAY TODAY  Your physician recommends that you schedule a follow-up appointment in: 2-3 WEEKS WITH DR. Martinique AT [facility]; IF NOT AVAILABLE THEN WITH SCOTT WEAVER, PAC AT [facility] OFFICE.

## 2014-03-24 LAB — HEPATIC FUNCTION PANEL
ALBUMIN: 3.4 g/dL — AB (ref 3.5–5.2)
ALK PHOS: 61 U/L (ref 39–117)
ALT: 18 U/L (ref 0–53)
AST: 23 U/L (ref 0–37)
Bilirubin, Direct: 0.1 mg/dL (ref 0.0–0.3)
TOTAL PROTEIN: 6.6 g/dL (ref 6.0–8.3)
Total Bilirubin: 0.4 mg/dL (ref 0.2–1.2)

## 2014-03-24 LAB — CBC WITH DIFFERENTIAL/PLATELET
BASOS ABS: 0.1 10*3/uL (ref 0.0–0.1)
Basophils Relative: 0.6 % (ref 0.0–3.0)
EOS ABS: 0.1 10*3/uL (ref 0.0–0.7)
Eosinophils Relative: 1.3 % (ref 0.0–5.0)
HEMATOCRIT: 33.8 % — AB (ref 39.0–52.0)
HEMOGLOBIN: 11.2 g/dL — AB (ref 13.0–17.0)
LYMPHS ABS: 2.6 10*3/uL (ref 0.7–4.0)
Lymphocytes Relative: 27.4 % (ref 12.0–46.0)
MCHC: 33.1 g/dL (ref 30.0–36.0)
MCV: 103 fl — AB (ref 78.0–100.0)
Monocytes Absolute: 0.8 10*3/uL (ref 0.1–1.0)
Monocytes Relative: 8.7 % (ref 3.0–12.0)
NEUTROS ABS: 6 10*3/uL (ref 1.4–7.7)
Neutrophils Relative %: 62 % (ref 43.0–77.0)
Platelets: 157 10*3/uL (ref 150.0–400.0)
RBC: 3.28 Mil/uL — ABNORMAL LOW (ref 4.22–5.81)
RDW: 14.5 % (ref 11.5–15.5)
WBC: 9.6 10*3/uL (ref 4.0–10.5)

## 2014-03-24 LAB — BASIC METABOLIC PANEL
BUN: 38 mg/dL — ABNORMAL HIGH (ref 6–23)
CO2: 28 meq/L (ref 19–32)
Calcium: 8.8 mg/dL (ref 8.4–10.5)
Chloride: 103 mEq/L (ref 96–112)
Creatinine, Ser: 1.8 mg/dL — ABNORMAL HIGH (ref 0.4–1.5)
GFR: 37.62 mL/min — ABNORMAL LOW (ref >60.00)
GLUCOSE: 118 mg/dL — AB (ref 70–99)
POTASSIUM: 3.9 meq/L (ref 3.5–5.1)
SODIUM: 141 meq/L (ref 135–145)

## 2014-03-24 LAB — TSH: TSH: 0.74 u[IU]/mL (ref 0.35–4.50)

## 2014-03-28 ENCOUNTER — Telehealth: Payer: Self-pay | Admitting: *Deleted

## 2014-03-28 NOTE — Telephone Encounter (Signed)
s/w pt's wife about cxr and lab results with verbal understanding from pt's wife. She states pt has had weakness and blurry vision since starting the imdur last week. I told her I will d/w PA and cb. I cb and advised to cut imdur to 15 mg daily (1/2 tab). Wife also asked if she could get the stool cards at the pharmacy in [place]. I said no because these have to go to our lab. She then asked could she get the stool cards with PCP in [place] since they "live a little ways away". I said that would be ok bu to let me know if they got the cards from PCP if not they will have to get them from here. Wife said and thank you.

## 2014-03-31 ENCOUNTER — Telehealth: Payer: Self-pay | Admitting: Physician Assistant

## 2014-03-31 NOTE — Telephone Encounter (Signed)
New message      Pt want Scott to know that he has blood in his stool.  Will you take him off of his blood thinner?

## 2014-03-31 NOTE — Telephone Encounter (Signed)
New message     We gave pt hemacult cards.  He took it to his PCP to have them check it.  It was positive.  Please call.

## 2014-04-01 NOTE — Telephone Encounter (Signed)
Hemoccult cards +. He is at much higher risk of stroke off anticoagulation. Eliquis may be a better choice than Xarelto. However, continue Xarelto for now. He needs a repeat CBC.  If Hgb is dropping, will have to consider stopping anticoagulation. Refer to GI asap. Arrange repeat CBC (today/Monday). Richardson Dopp, PA-C   04/01/2014 1:35 PM

## 2014-04-01 NOTE — Telephone Encounter (Signed)
s/w pt's wife about the results of the heme stool cards + x 6. Advised PA wants to refer to GI and get repeat cbc next week. Wife said Dr. Delena Bali office was doing this already as of yesterday. I  then s/w Maudie Mercury, RN @ PCP, confirms GI being set up w/Dr. Miseinheimer in [place]. I asked Maudie Mercury about cbc, she said she will d/w PCP and GI about getting repeat labs. I thanked Maudie Mercury for all of her help on this matter. I also told Mrs. Mcdowell to please tell pt that our office said Happy 89th Birthday today. She thanked me for the kind thoughts.

## 2014-04-01 NOTE — Telephone Encounter (Signed)
Maudie Mercury, RN at PCP office also states Dr. Delena Bali PCP d/c'd Xarelto yesterday.

## 2014-04-04 ENCOUNTER — Encounter: Payer: Self-pay | Admitting: Physician Assistant

## 2014-04-11 ENCOUNTER — Ambulatory Visit: Payer: Medicare Other | Admitting: Physician Assistant

## 2014-04-12 ENCOUNTER — Encounter: Payer: Self-pay | Admitting: Physician Assistant

## 2014-04-12 ENCOUNTER — Ambulatory Visit (INDEPENDENT_AMBULATORY_CARE_PROVIDER_SITE_OTHER): Payer: Medicare Other | Admitting: Physician Assistant

## 2014-04-12 VITALS — BP 140/60 | HR 76 | Ht 67.0 in | Wt 177.0 lb

## 2014-04-12 DIAGNOSIS — Z8719 Personal history of other diseases of the digestive system: Secondary | ICD-10-CM

## 2014-04-12 DIAGNOSIS — R079 Chest pain, unspecified: Secondary | ICD-10-CM

## 2014-04-12 DIAGNOSIS — I482 Chronic atrial fibrillation, unspecified: Secondary | ICD-10-CM

## 2014-04-12 DIAGNOSIS — R0600 Dyspnea, unspecified: Secondary | ICD-10-CM

## 2014-04-12 DIAGNOSIS — R0609 Other forms of dyspnea: Secondary | ICD-10-CM

## 2014-04-12 DIAGNOSIS — D649 Anemia, unspecified: Secondary | ICD-10-CM

## 2014-04-12 DIAGNOSIS — R0602 Shortness of breath: Secondary | ICD-10-CM

## 2014-04-12 DIAGNOSIS — I5022 Chronic systolic (congestive) heart failure: Secondary | ICD-10-CM

## 2014-04-12 DIAGNOSIS — I4891 Unspecified atrial fibrillation: Secondary | ICD-10-CM

## 2014-04-12 DIAGNOSIS — R0989 Other specified symptoms and signs involving the circulatory and respiratory systems: Secondary | ICD-10-CM

## 2014-04-12 DIAGNOSIS — N189 Chronic kidney disease, unspecified: Secondary | ICD-10-CM

## 2014-04-12 MED ORDER — FUROSEMIDE 80 MG PO TABS: ORAL_TABLET | ORAL | Status: AC

## 2014-04-12 NOTE — Progress Notes (Signed)
Cardiology Office Note    Date:  04/12/2014   ID:  Manuel [name], DOB 12-Sep-1922, MRN [id number]  PCP:  Nicoletta Dress, MD  Cardiologist:  Dr. Peter Martinique      History of Present Illness: Manuel [name] is a 80 y.o. male with a history of chronic atrial fibrillation, dilated cardiomyopathy treated medically, systolic CHF, prior GI bleed on Coumadin, prior stroke, remote pulmonary embolism status post IVC filter, COPD on chronic O2, CKD, prostate CA previously tx with Lupron. EF has been in the 40-45% range. He has multifactorial dyspnea. He had recently been on Xarelto for anticoagulation.   I saw him 03/24/15. He felt that he had no energy and his breathing was getting worse. He also noted worsening chest tightness. We had a long discussion regarding conservative management versus aggressive workup. He preferred a more conservative approach. I adjusted his Lasix. I placed him on low-dose isosorbide. Chest x-ray was obtained at [facility]. This was unremarkable. CBC demonstrated worsening hemoglobin (13.8 in 05/2012 >>> 11.2). Hemoccults were obtained and these were positive. His PCP took him off of Xarelto. He is being referred back to gastroenterology.    He returns for follow up.  He did see gastroenterology. He tells them that he was felt to be an acceptable candidate to resume anticoagulation. His symptoms improved since last visit. However, over the last few days, he has noted a 3 pound weight gain. He has developed worsening dyspnea on exertion. He has developed recurrent chest tightness. This typically occurs at any time. He denies exertional symptoms. He is NYHA class 3-3b. He denies orthopnea or PND. He has chronic left greater than right edema. He denies syncope.   Studies:  - Echo (08/25/12):  EF 40-45%, diffuse HK, mild AI, mild MR, moderate LAE, mild RV, mild RAE, moderate TR, PASP 50 mm Hg  - Nuclear ([facility]):  No ischemia, EF 39%  - Carotid  US (04/2012):  Bilateral no significant ICA stenosis   Recent Labs: 03/23/2014: ALT 18; Creatinine 1.8*; Hemoglobin 11.2*; Potassium 3.9; TSH 0.74   Wt Readings from Last 3 Encounters:  04/12/14 177 lb (80.287 kg)  03/23/14 174 lb (78.926 kg)  08/30/13 174 lb (78.926 kg)     Past Medical History  Diagnosis Date  . Pulmonary embolism 2003  . COPD (chronic obstructive pulmonary disease)     on home O2  . Hypertension   . Atrial fibrillation   . Hyperlipidemia   . Prostate cancer   . Colon polyps   . Ulnar fracture   . CVA (cerebral infarction)   . CKD (chronic kidney disease) stage 3, GFR 30-59 ml/min   . Hypothyroidism   . Stroke     Current Outpatient Prescriptions  Medication Sig Dispense Refill  . acetaminophen (TYLENOL) 500 MG tablet Take 1,000 mg by mouth every 6 (six) hours as needed. pain      . albuterol (2.5 MG/3ML) 0.083% NEBU 3 mL, albuterol (5 MG/ML) 0.5% NEBU 0.5 mL Inhale into the lungs every 6 (six) hours.      . carvedilol (COREG) 6.25 MG tablet TAKE 1 TABLET BY MOUTH TWICE A DAY  180 tablet  2  . digoxin (LANOXIN) 0.125 MG tablet 1/2 TAB DAILY  90 tablet  3  . febuxostat (ULORIC) 40 MG tablet Take 40 mg by mouth every evening.       . fish oil-omega-3 fatty acids 1000 MG capsule Take 1 g by mouth daily.      Marland Kitchen  furosemide (LASIX) 80 MG tablet TAKE 80 MG IN THE AM AND 40 MG IN THE EARLY AFTERNOON  180 tablet  3  . isosorbide mononitrate (IMDUR) 30 MG 24 hr tablet Take 1 tablet (30 mg total) by mouth daily.  90 tablet  3  . lansoprazole (PREVACID) 30 MG capsule Take 30 mg by mouth every evening.      Marland Kitchen Leuprolide Acetate (LUPRON IJ) Inject as directed. PT  TAKES 1 INJECTION PRN      . levothyroxine (SYNTHROID, LEVOTHROID) 75 MCG tablet Take 75 mcg by mouth daily.      Marland Kitchen loratadine (CLARITIN) 10 MG tablet Take 10 mg by mouth daily.      . Multiple Vitamin (MULTIVITAMIN WITH MINERALS) TABS Take 1 tablet by mouth every evening.      . NON FORMULARY Oxygen  ---- 3  Liters 24 hours continuous      . Polyethyl Glycol-Propyl Glycol (SYSTANE) 0.4-0.3 % SOLN Apply 1 drop to eye daily as needed. For dry eyes      . simvastatin (ZOCOR) 40 MG tablet Take 1 tablet (40 mg total) by mouth every evening.  30 tablet  11  . sodium chloride (OCEAN) 0.65 % nasal spray Place 1 spray into the nose as needed. For congestion      . vitamin B-12 (CYANOCOBALAMIN) 1000 MCG tablet pT NOT LONGER TAKING INJECTIONS -PT TAKING 1 TAB DAILY      . Wheat Dextrin (BENEFIBER PO) Take 1 tablet by mouth daily.       No current facility-administered medications for this visit.    Allergies:   Albuterol sulfate and Morphine and related   Social History:  The patient  reports that he quit smoking about 62 years ago. His smoking use included Cigarettes. He has a 25 pack-year smoking history. He has never used smokeless tobacco. He reports that he does not drink alcohol or use illicit drugs.   Family History:  The patient's family history includes Cancer in his father and mother; Heart attack in his brother; Heart disease in his father; Hypertension in his brother, father, mother, and sister; Ovarian cancer in an other family member.   ROS:  Please see the history of present illness.  He denies any bleeding problems  All other systems reviewed and negative.   PHYSICAL EXAM: VS:  BP 140/60  Pulse 76  Ht 5\' 7"  (1.702 m)  Wt 177 lb (80.287 kg)  BMI 27.72 kg/m2 Well nourished, well developed, in no acute distress HEENT: normal Neck: minimal elevated JVD Cardiac:  normal S1, S2;  irregularly irregular rhythm;   2/6 systolic murmur heard best along the LSB Lungs:   Decreased breath sounds bilaterally, no wheezing, rhonchi or rales Abd: soft, nontender, no hepatomegaly Ext: 1-2+ bilateral LE edema, left >right Skin: warm and dry Neuro:  CNs 2-12 intact, no focal abnormalities noted  EKG:  Atrial fibrillation, HR 76, RBBB     ASSESSMENT AND PLAN:  1. Shortness of breath:  His dyspnea  appears to be multifactorial. CHF seems to be playing somewhat of a role. I will increase his Lasix back to 80 mg twice a day. Obtain a followup basic metabolic panel in one week. 2. Chest pain, unspecified: I suspect his chest tightness is related to volume excess. He continues to prefer a conservative approach. Continue isosorbide. Increase Lasix as noted. If symptoms progress, we can certainly consider nuclear stress test. 3. GI Bleed:  As noted, he saw gastroenterology. He was told he  could resume anticoagulation. 4. Chronic systolic heart failure:  Increase Lasix as noted above. Plan follow basic metabolic panel in one week. 5. Chronic atrial fibrillation:  Rate controlled. He plans to followup with primary care within the next week to arrange initiation of Coumadin. 6. Hyperlipidemia:  Continue statin. 7. COPD:  He remains on O2.  I suspect this is playing a large role in his dyspnea and possibly his chest pain. 8. Disposition: Follow up with me or Dr. Martinique 4-6 weeks.   Signed, Versie Starks, MHS 04/12/2014 3:14 PM    [facility] [address] [address], [place], [place]  [address] Phone: [phone]; Fax: [phone]

## 2014-04-12 NOTE — Patient Instructions (Addendum)
INCREASE LASIX TO 80 MG TWICE DAILY; NEW RX SENT IN WITH NEW DIRECTIONS  YOU HAVE BEEN GIVEN AN RX FOR LAB WORK TO BE DONE IN 1 WEEK AT YOUR PRIMARY CARE PHYSICIAN'S OFFICE; PLEASE HAVE LAB RESULTS FAXED TO [facility], [place]  PER [facility], PAC FOR YOU TO FOLLOW UP WITH PRIMARY CARE  NEXT WEEK TO DISCUSS ABOUT WHEN YOU CAN RE-START COUMADIN  I WILL FAX YOUR LAST LAB RESULTS 03/23/14 FROM OUR OFFICE TO PCP FOR YOU TODAY  Your physician recommends that you schedule a follow-up appointment in: [place]. [place]

## 2014-04-14 ENCOUNTER — Encounter: Payer: Self-pay | Admitting: Cardiology

## 2014-04-19 ENCOUNTER — Encounter: Payer: Self-pay | Admitting: Cardiology

## 2014-05-11 ENCOUNTER — Ambulatory Visit (INDEPENDENT_AMBULATORY_CARE_PROVIDER_SITE_OTHER): Payer: Medicare Other | Admitting: Cardiology

## 2014-05-11 ENCOUNTER — Encounter: Payer: Self-pay | Admitting: Cardiology

## 2014-05-11 VITALS — BP 142/60 | HR 77 | Ht 67.0 in | Wt 176.0 lb

## 2014-05-11 DIAGNOSIS — Z7901 Long term (current) use of anticoagulants: Secondary | ICD-10-CM

## 2014-05-11 DIAGNOSIS — I2789 Other specified pulmonary heart diseases: Secondary | ICD-10-CM

## 2014-05-11 DIAGNOSIS — I482 Chronic atrial fibrillation, unspecified: Secondary | ICD-10-CM

## 2014-05-11 DIAGNOSIS — I5042 Chronic combined systolic (congestive) and diastolic (congestive) heart failure: Secondary | ICD-10-CM

## 2014-05-11 DIAGNOSIS — I2699 Other pulmonary embolism without acute cor pulmonale: Secondary | ICD-10-CM

## 2014-05-11 DIAGNOSIS — R0609 Other forms of dyspnea: Secondary | ICD-10-CM

## 2014-05-11 DIAGNOSIS — R0600 Dyspnea, unspecified: Secondary | ICD-10-CM

## 2014-05-11 DIAGNOSIS — R0989 Other specified symptoms and signs involving the circulatory and respiratory systems: Secondary | ICD-10-CM

## 2014-05-11 DIAGNOSIS — I4891 Unspecified atrial fibrillation: Secondary | ICD-10-CM

## 2014-05-11 DIAGNOSIS — I272 Pulmonary hypertension, unspecified: Secondary | ICD-10-CM

## 2014-05-11 DIAGNOSIS — I509 Heart failure, unspecified: Secondary | ICD-10-CM

## 2014-05-11 NOTE — Patient Instructions (Addendum)
Continue your current therapy- I would take your lasix 80 mg twice a day rather than 160 mg once a day.  Avoid sodium  I will see you in 3 months.

## 2014-05-12 NOTE — Progress Notes (Signed)
Cardiology Office Note    Date:  05/12/2014   ID:  Manuel [name], DOB 09-16-1922, MRN [id number]  PCP:  Nicoletta Dress, MD       History of Present Illness: Manuel [name] is a 80 y.o. male with a history of chronic atrial fibrillation, dilated cardiomyopathy treated medically, systolic CHF, prior GI bleed on Xarelto, prior stroke, remote pulmonary embolism status post IVC filter, COPD on chronic O2, CKD, prostate CA previously tx with Lupron. EF has been in the 40-45% range.  His daughter reports he has a hard time getting enough oxygen. He is SOB with minimal activity. He has no stamina. Weight has been stable. No increase edema. Was seen by GI and felt that he could resume anticoagulation and is now on Coumadin. He does complain of intermittent hot flashes and some chest tightness.    Studies:  - Echo (08/25/12):  EF 40-45%, diffuse HK, mild AI, mild MR, moderate LAE, mild RV, mild RAE, moderate TR, PASP 50 mm Hg  - Nuclear ([facility]):  No ischemia, EF 39%  - Carotid US (04/2012):  Bilateral no significant ICA stenosis   Recent Labs: 03/23/2014: ALT 18; Creatinine 1.8*; Hemoglobin 11.2*; Potassium 3.9; TSH 0.74   Wt Readings from Last 3 Encounters:  05/11/14 176 lb (79.833 kg)  04/12/14 177 lb (80.287 kg)  03/23/14 174 lb (78.926 kg)     Past Medical History  Diagnosis Date  . Pulmonary embolism 2003  . COPD (chronic obstructive pulmonary disease)     on home O2  . Hypertension   . Atrial fibrillation   . Hyperlipidemia   . Prostate cancer   . Colon polyps   . Ulnar fracture   . CVA (cerebral infarction)   . CKD (chronic kidney disease) stage 3, GFR 30-59 ml/min   . Hypothyroidism   . Stroke     Current Outpatient Prescriptions  Medication Sig Dispense Refill  . acetaminophen (TYLENOL) 500 MG tablet Take 1,000 mg by mouth every 6 (six) hours as needed. pain      . albuterol (2.5 MG/3ML) 0.083% NEBU 3 mL, albuterol (5 MG/ML) 0.5% NEBU  0.5 mL Inhale into the lungs every 6 (six) hours.      Marland Kitchen atorvastatin (LIPITOR) 40 MG tablet Take 40 mg by mouth daily.      . carvedilol (COREG) 6.25 MG tablet TAKE 1 TABLET BY MOUTH TWICE A DAY  180 tablet  2  . digoxin (LANOXIN) 0.125 MG tablet 1/2 TAB DAILY  90 tablet  3  . febuxostat (ULORIC) 40 MG tablet Take 40 mg by mouth every evening.       . fish oil-omega-3 fatty acids 1000 MG capsule Take 1 g by mouth daily.      . furosemide (LASIX) 80 MG tablet TAKE 80 MG TWICE DAILY  180 tablet  3  . isosorbide mononitrate (IMDUR) 30 MG 24 hr tablet Take 1 tablet (30 mg total) by mouth daily.  90 tablet  3  . lansoprazole (PREVACID) 30 MG capsule Take 30 mg by mouth every evening.      Marland Kitchen Leuprolide Acetate (LUPRON IJ) Inject as directed. PT  TAKES 1 INJECTION PRN      . levothyroxine (SYNTHROID, LEVOTHROID) 75 MCG tablet Take 75 mcg by mouth daily.      Marland Kitchen loratadine (CLARITIN) 10 MG tablet Take 10 mg by mouth daily.      . Multiple Vitamin (MULTIVITAMIN WITH MINERALS) TABS Take 1 tablet by mouth  every evening.      . NON FORMULARY Oxygen  ---- 3 Liters 24 hours continuous      . Polyethyl Glycol-Propyl Glycol (SYSTANE) 0.4-0.3 % SOLN Apply 1 drop to eye daily as needed. For dry eyes      . sodium chloride (OCEAN) 0.65 % nasal spray Place 1 spray into the nose as needed. For congestion      . vitamin B-12 (CYANOCOBALAMIN) 1000 MCG tablet pT NOT LONGER TAKING INJECTIONS -PT TAKING 1 TAB DAILY      . warfarin (COUMADIN) 3 MG tablet Take 3 mg by mouth daily.      . Wheat Dextrin (BENEFIBER PO) Take 1 tablet by mouth daily.       No current facility-administered medications for this visit.    Allergies:   Albuterol sulfate and Morphine and related   Social History:  The patient  reports that he quit smoking about 62 years ago. His smoking use included Cigarettes. He has a 25 pack-year smoking history. He has never used smokeless tobacco. He reports that he does not drink alcohol or use illicit  drugs.   Family History:  The patient's family history includes Cancer in his father and mother; Heart attack in his brother; Heart disease in his father; Hypertension in his brother, father, mother, and sister; Ovarian cancer in an other family member.   ROS:  Please see the history of present illness.  He denies any bleeding problems  All other systems reviewed and negative.   PHYSICAL EXAM: VS:  BP 142/60  Pulse 77  Ht 5\' 7"  (1.702 m)  Wt 176 lb (79.833 kg)  BMI 27.56 kg/m2  SpO2 93% Well nourished, well developed, in no acute distress. Wearing oxygen.  HEENT: normal Neck: minimal elevated JVD Cardiac:  normal S1, S2;  irregularly irregular rhythm;   2/6 systolic murmur heard best along the LSB Lungs:   Decreased breath sounds bilaterally, no wheezing, rhonchi or rales Abd: soft, nontender, no hepatomegaly Ext: 1+ bilateral LE edema,  Skin: warm and dry Neuro:  CNs 2-12 intact, no focal abnormalities noted  Lab data:    Dated 05/09/14: Hgb 12.2, glucose 98, BUN 26, creatinine 1.72. Electrolytes and liver function normal. Cholesterol 151, trigl- 222, HDL 34, LDL 73. TSH 1.8, Dig level 0.7.  ASSESSMENT AND PLAN:  1. Shortness of breath:  His dyspnea is multifactorial. CHF seems to be playing somewhat of a role. He has been taking his lasix 160 mg once a day. I think he would do better to take 80 mg twice a day. Does not appear to be terribly volume overloaded. I am not sure we have much else to offer. High salt intake has been a chronic problem. 2. Chest pain. Not a candidate for aggressive work up. 3. GI Bleed:  As noted, he saw gastroenterology. He was told he could resume anticoagulation. Now back on coumadin. 4. Chronic systolic heart failure:  change Lasix as noted above.  5. Chronic atrial fibrillation:  Rate controlled.  6. Hyperlipidemia:  Continue statin. 7. COPD:  He remains on O2.    Signed, Johnny Latu Martinique MD, [facility]    05/12/2014 11:41 AM

## 2014-06-16 ENCOUNTER — Other Ambulatory Visit: Payer: Self-pay

## 2014-06-16 MED ORDER — CARVEDILOL 6.25 MG PO TABS: ORAL_TABLET | ORAL | Status: AC

## 2014-08-12 ENCOUNTER — Ambulatory Visit: Payer: Medicare Other | Admitting: Cardiology

## 2014-08-23 ENCOUNTER — Encounter: Payer: Self-pay | Admitting: Cardiology

## 2014-11-24 ENCOUNTER — Encounter: Payer: Self-pay | Admitting: Cardiology

## 2015-02-08 ENCOUNTER — Encounter: Payer: Self-pay | Admitting: Critical Care Medicine

## 2015-02-08 ENCOUNTER — Ambulatory Visit (INDEPENDENT_AMBULATORY_CARE_PROVIDER_SITE_OTHER): Payer: Medicare Other | Admitting: Critical Care Medicine

## 2015-02-08 ENCOUNTER — Ambulatory Visit (INDEPENDENT_AMBULATORY_CARE_PROVIDER_SITE_OTHER)
Admission: RE | Admit: 2015-02-08 | Discharge: 2015-02-08 | Disposition: A | Discharge status: Home / Self Care | Payer: Medicare Other | Source: Ambulatory Visit | Attending: Critical Care Medicine | Admitting: Critical Care Medicine

## 2015-02-08 VITALS — BP 104/68 | HR 65 | Temp 97.4°F | Ht 67.0 in | Wt 171.0 lb

## 2015-02-08 DIAGNOSIS — J810 Acute pulmonary edema: Secondary | ICD-10-CM

## 2015-02-08 DIAGNOSIS — I5042 Chronic combined systolic (congestive) and diastolic (congestive) heart failure: Secondary | ICD-10-CM

## 2015-02-08 DIAGNOSIS — I482 Chronic atrial fibrillation, unspecified: Secondary | ICD-10-CM

## 2015-02-08 DIAGNOSIS — J449 Chronic obstructive pulmonary disease, unspecified: Secondary | ICD-10-CM

## 2015-02-08 DIAGNOSIS — I270 Primary pulmonary hypertension: Secondary | ICD-10-CM

## 2015-02-08 DIAGNOSIS — I272 Pulmonary hypertension, unspecified: Secondary | ICD-10-CM

## 2015-02-08 NOTE — Patient Instructions (Addendum)
We will switch you from pulsed oxygen to a continuous flow.  Please use 4 lpm. We will obtain echo results from January at [facility] Stop Singulair Use Duoneb four times daily We will schedule you an appt with Dr. Doug Sou office Follow up in 6 wks in the [facility] office Chest xray

## 2015-02-08 NOTE — Progress Notes (Addendum)
Subjective:    Patient ID: Manuel [name], male    DOB: 04/30/1922, 81 y.o.   MRN: [id number]  HPI Comments: Patient presents with: Hospitalization Follow-up: seen by MR Jan 2014.  Admitted to [facility] April 2016 with pna.  SOB worsening - started approx 1-2 wks after d/c.  Occas dry cough.  No chest tightness, CP, hemoptysis, or f/c/s.  Copd / PNA consult. Pt just in hosp 12/2014 at [place] [facility]. Pt had RLL CAP NOS and ATX, infiltrates.  Rx for same. Now with dyspnea and cough.   Has been on oxygen since 2003.  Dx copd long term.  Pt still with edeam.  Cards is [place].  Hx of afib.    Since d/c much worse last few weeks.  No real mucus.  Pt in hosp 09/2014 same issue.  On ABX and lasix is better.  Lives with spouse.  No hx of intubation, does not want to be intubated    Shortness of Breath This is a chronic problem. The current episode started more than 1 year ago. The problem occurs constantly. Pertinent negatives include no abdominal pain, chest pain, ear pain, fever, headaches, leg swelling, neck pain, rash, rhinorrhea, sore throat, vomiting or wheezing. The symptoms are aggravated by odors, fumes, any activity, weather changes and URIs. Risk factors include smoking. He has tried beta agonist inhalers for the symptoms. The treatment provided mild relief. His past medical history is significant for chronic lung disease, COPD, a heart failure and pneumonia. There is no history of allergies, aspirin allergies, asthma, bronchiolitis, CAD, DVT, PE or a recent surgery.   sats 70% on 4L pulse on arrival  Past Medical History  Diagnosis Date  . Pulmonary embolism 2003  . COPD (chronic obstructive pulmonary disease)     on home O2  . Hypertension   . Atrial fibrillation   . Hyperlipidemia   . Prostate cancer   . Colon polyps   . Ulnar fracture   . CVA (cerebral infarction)   . CKD (chronic kidney disease) stage 3, GFR 30-59 ml/min   . Hypothyroidism   . Stroke   . Pulmonary  hypertension      Family History  Problem Relation Age of Onset  . Heart disease Father   . Ovarian cancer    . Heart attack Brother   . Cancer Mother   . Cancer Father   . Hypertension Mother   . Hypertension Father   . Hypertension Sister   . Hypertension Brother      History   Social History  . Marital Status: Married    Spouse Name: N/A  . Number of Children: 2  . 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 Smoking status: Former Smoker -- 1.00 packs/day for 15 years    Types: Cigarettes    Quit date: 09/24/1951  . Smokeless tobacco: Never Used  . Alcohol Use: No  . Drug Use: No  . Sexual Activity: Not Currently   Other Topics Concern  . Not on file   Social History Narrative     Allergies  Allergen Reactions  . Morphine And Related Nausea And Vomiting     Outpatient Prescriptions Prior to Visit  Medication Sig Dispense Refill  . acetaminophen (TYLENOL) 500 MG tablet Take 1,000 mg by mouth every 6 (six) hours as needed. pain    . atorvastatin (LIPITOR) 40 MG tablet Take 40 mg by  mouth daily.    . carvedilol (COREG) 6.25 MG tablet TAKE 1 TABLET BY MOUTH TWICE A DAY 180 tablet 1  . digoxin (LANOXIN) 0.125 MG tablet 1/2 TAB DAILY 90 tablet 3  . febuxostat (ULORIC) 40 MG tablet Take 40 mg by mouth every evening.     . fish oil-omega-3 fatty acids 1000 MG capsule Take 1 g by mouth daily.    . furosemide (LASIX) 80 MG tablet TAKE 80 MG TWICE DAILY 180 tablet 3  . lansoprazole (PREVACID) 30 MG capsule Take 30 mg by mouth every evening.    Marland Kitchen levothyroxine (SYNTHROID, LEVOTHROID) 75 MCG tablet Take 75 mcg by mouth daily.    Marland Kitchen loratadine (CLARITIN) 10 MG tablet Take 10 mg by mouth daily.    . Multiple Vitamin (MULTIVITAMIN WITH MINERALS) TABS Take 1 tablet by mouth every evening.    . NON FORMULARY Oxygen  ---- 4 Liters 24 hours continuous    . Polyethyl Glycol-Propyl Glycol (SYSTANE) 0.4-0.3 % SOLN  Apply 1 drop to eye daily as needed. For dry eyes    . sodium chloride (OCEAN) 0.65 % nasal spray Place 1 spray into the nose as needed. For congestion    . vitamin B-12 (CYANOCOBALAMIN) 1000 MCG tablet Take 1,000 mcg by mouth daily.     . Wheat Dextrin (BENEFIBER PO) Take 1 tablet by mouth daily.    Marland Kitchen albuterol (2.5 MG/3ML) 0.083% NEBU 3 mL, albuterol (5 MG/ML) 0.5% NEBU 0.5 mL Inhale into the lungs every 6 (six) hours.    . isosorbide mononitrate (IMDUR) 30 MG 24 hr tablet Take 1 tablet (30 mg total) by mouth daily. (Patient not taking: Reported on 02/08/2015) 90 tablet 3  . Leuprolide Acetate (LUPRON IJ) Inject as directed. PT  TAKES 1 INJECTION PRN    . warfarin (COUMADIN) 3 MG tablet Take 3 mg by mouth daily.     No facility-administered medications prior to visit.     Review of Systems  Constitutional: Negative for fever, chills, diaphoresis, activity change, appetite change, fatigue and unexpected weight change.  HENT: Negative for congestion, dental problem, ear discharge, ear pain, facial swelling, hearing loss, mouth sores, nosebleeds, postnasal drip, rhinorrhea, sinus pressure, sneezing, sore throat, tinnitus, trouble swallowing and voice change.   Eyes: Negative for photophobia, discharge, itching and visual disturbance.  Respiratory: Positive for shortness of breath. Negative for apnea, cough, choking, chest tightness, wheezing and stridor.   Cardiovascular: Negative for chest pain, palpitations and leg swelling.  Gastrointestinal: Negative for nausea, vomiting, abdominal pain, constipation, blood in stool and abdominal distention.  Genitourinary: Negative for dysuria, urgency, frequency, hematuria, flank pain, decreased urine volume and difficulty urinating.  Musculoskeletal: Negative for myalgias, back pain, joint swelling, arthralgias, gait problem, neck pain and neck stiffness.  Skin: Negative for color change, pallor and rash.  Neurological: Negative for dizziness, tremors,  seizures, syncope, speech difficulty, weakness, light-headedness, numbness and headaches.  Hematological: Negative for adenopathy. Does not bruise/bleed easily.  Psychiatric/Behavioral: Negative for confusion, sleep disturbance and agitation. The patient is not nervous/anxious.        Objective:   Physical Exam  Constitutional: He appears well-developed and well-nourished. He is active.  Non-toxic appearance. He does not appear ill. No distress.  HENT:  Head: Normocephalic and atraumatic.  Nose: No mucosal edema, rhinorrhea, sinus tenderness, nasal deformity or septal deviation. No epistaxis. Right sinus exhibits no maxillary sinus tenderness and no frontal sinus tenderness. Left sinus exhibits no maxillary sinus tenderness and no frontal sinus tenderness.  Mouth/Throat: Oropharynx is clear and moist. No oropharyngeal exudate.  Eyes: Conjunctivae and EOM are normal. Pupils are equal, round, and reactive to light. Right eye exhibits no discharge. Left eye exhibits no discharge. No scleral icterus.  Neck: Normal range of motion. Neck supple. Normal carotid pulses, no hepatojugular reflux and no JVD present. No tracheal tenderness and no muscular tenderness present. Carotid bruit is not present. No rigidity. No tracheal deviation, no edema, no erythema and normal range of motion present. No thyroid mass and no thyromegaly present.  Cardiovascular: Normal rate, regular rhythm, S1 normal, S2 normal, normal heart sounds, intact distal pulses and normal pulses.  PMI is not displaced.  Exam reveals no gallop, no S3, no S4, no distant heart sounds and no friction rub.   No murmur heard.  No systolic murmur is present   No diastolic murmur is present  Pulmonary/Chest: No accessory muscle usage or stridor. No apnea and no tachypnea. No respiratory distress. He has decreased breath sounds. He has no wheezes. He has no rhonchi. He has rales in the right lower field and the left lower field. Chest wall is not  dull to percussion. He exhibits no mass, no tenderness, no bony tenderness and no deformity.  Abdominal: Soft. Normal appearance and bowel sounds are normal. He exhibits no distension, no ascites and no mass. There is no hepatosplenomegaly. There is no tenderness. There is no rigidity, no rebound, no guarding and no CVA tenderness.  Musculoskeletal: Normal range of motion.  Lymphadenopathy:       Head (right side): No submental and no submandibular adenopathy present.       Head (left side): No submental and no submandibular adenopathy present.    He has no cervical adenopathy.    He has no axillary adenopathy.  Neurological: He is alert. He has normal strength and normal reflexes. No cranial nerve deficit or sensory deficit.  Skin: Skin is warm and dry. No bruising, no ecchymosis, no lesion and no rash noted. He is not diaphoretic. No cyanosis or erythema. No pallor. Nails show no clubbing.  Psychiatric: He has a normal mood and affect. His speech is normal and behavior is normal.  Vitals reviewed.   Dg Chest 2 View  02/08/2015   CLINICAL DATA:  History of congestive heart failure and pneumonia 1 month ago, shortness of breath  EXAM: CHEST  2 VIEW  COMPARISON:  Chest x-ray of 12/29/2014 and CT chest of 12/28/2014  FINDINGS: Prominent markings at the lung bases are most consistent with basilar fibrosis when compared to the recent CT scan of the chest. The lungs remain hyperaerated. No definite pneumonia or effusion is seen. Moderate cardiomegaly is stable. The bones are osteopenic.  IMPRESSION: 1. No definite pneumonia or effusion. 2. Bibasilar fibrosis. 3. Stable cardiomegaly.   Electronically Signed   By: Ivar Drape M.D.   On: 02/08/2015 17:02        Assessment & Plan:  I personally reviewed all images and lab data in the [facility] system as well as any outside material available during this office visit and agree with the  radiology impressions.  I also have reviewed any data /notes/records if  available in care everywhere.  Chronic combined systolic and diastolic CHF (congestive heart failure) Ongoing systolic and diastolic HF with chronic pulm edema EF 55% but severe pulm HTN on echo 09/2014 Suspect d/t severe copd and chronic hypoxemic resp failure Note CXR today stable, no pna or pulm edema, severe copd changes and some fibrosis  related to copd , doubt ILD  Plan Cont oxygen continuous 4L , d/c pulse port oxygen, go with continuous flow Cont diuretic No need for Potassium supp     COPD with chronic bronchitis gold D Copd  Gold D copd oxygen dependent exac by CHF and pulm edema CXR today shows no edema, severe copd  Echo from 09/2014: severe pulm htn, ef 55% Plan Change  pulsed oxygen to a continuous flow.  Please use 4 lpm. Stop Singulair Use Duoneb four times daily Short term cards f/u Follow up in 6 wks in the [place] office    Pulmonary hypertension Secondary pulm htn, not primary and d/t lung dz Not a candidate for primary treatment of same     Traeger was seen today for hospitalization follow-up.  Diagnoses and all orders for this visit:  Pulmonary hypertension Orders: -     AMB REFERRAL FOR DME  Chronic combined systolic and diastolic CHF (congestive heart failure) Orders: -     Ambulatory referral to Cardiology  Acute pulmonary edema Orders: -     DG Chest 2 View; Future  COPD with chronic bronchitis gold D Copd   Chronic atrial fibrillation    I had an extended discussion with the patient reviewing all relevant studies completed to date and  lasting 15 to 20 minutes of a 25 minute visit on the following ongoing concerns:  Pt diagnosis of copd, oxygen rx changes, diuretic rx Reviewed all outside records including recent d/c summ/ reports from [facility]

## 2015-02-08 NOTE — Assessment & Plan Note (Addendum)
Ongoing systolic and diastolic HF with chronic pulm edema EF 55% but severe pulm HTN on echo 09/2014 Suspect d/t severe copd and chronic hypoxemic resp failure Note CXR today stable, no pna or pulm edema, severe copd changes and some fibrosis related to copd , doubt ILD  Plan Cont oxygen continuous 4L , d/c pulse port oxygen, go with continuous flow Cont diuretic No need for Potassium supp

## 2015-02-08 NOTE — Assessment & Plan Note (Addendum)
Gold D copd oxygen dependent exac by CHF and pulm edema CXR today shows no edema, severe copd  Echo from 09/2014: severe pulm htn, ef 55% Plan Change  pulsed oxygen to a continuous flow.  Please use 4 lpm. Stop Singulair Use Duoneb four times daily Short term cards f/u Follow up in 6 wks in the [place] office

## 2015-02-09 NOTE — Assessment & Plan Note (Signed)
Secondary pulm htn, not primary and d/t lung dz Not a candidate for primary treatment of same

## 2015-03-21 ENCOUNTER — Ambulatory Visit (INDEPENDENT_AMBULATORY_CARE_PROVIDER_SITE_OTHER): Payer: Medicare Other | Admitting: Critical Care Medicine

## 2015-03-21 ENCOUNTER — Encounter: Payer: Self-pay | Admitting: Critical Care Medicine

## 2015-03-21 ENCOUNTER — Telehealth: Payer: Self-pay | Admitting: Critical Care Medicine

## 2015-03-21 VITALS — BP 144/60 | HR 59 | Temp 96.0°F | Ht 67.0 in | Wt 170.8 lb

## 2015-03-21 DIAGNOSIS — J449 Chronic obstructive pulmonary disease, unspecified: Secondary | ICD-10-CM

## 2015-03-21 DIAGNOSIS — J432 Centrilobular emphysema: Secondary | ICD-10-CM

## 2015-03-21 DIAGNOSIS — Z66 Do not resuscitate: Secondary | ICD-10-CM | POA: Insufficient documentation

## 2015-03-21 DIAGNOSIS — J849 Interstitial pulmonary disease, unspecified: Secondary | ICD-10-CM

## 2015-03-21 DIAGNOSIS — J9611 Chronic respiratory failure with hypoxia: Secondary | ICD-10-CM

## 2015-03-21 NOTE — Progress Notes (Signed)
Subjective:    Patient ID: Manuel [name], male    DOB: 1922-05-12, 81 y.o.   MRN: [id number]  HPI 03/21/2015 Chief Complaint  Patient presents with  . Follow-up    Oxygen 4L pulsing 73%,placed on cont. 4L 91%.Sob worse x 1 wk. ,wt. increased tracking at home more edema,dry occass. cough,wheezing, midchest tightness,no fcs   Pt much worse .  Not wearing oxygen continuously on exertion. Pt tries to go out to eat a lot. O2 drops a lot if walking.  Notes more edema and weight is up slightly.  No real mucus. On the neb med 4x day.  Notes chest tightness  Change pulsed oxygen to a continuous flow. Please use 4 lpm. Stop Singulair Use Duoneb four times daily Pt denies any significant sore throat, nasal congestion or excess secretions, fever, chills, sweats, unintended weight loss, pleurtic or exertional chest pain, orthopnea PND, or leg swelling Pt denies any increase in rescue therapy over baseline, denies waking up needing it or having any early am or nocturnal exacerbations of coughing/wheezing/or dyspnea. Pt also denies any obvious fluctuation in symptoms with  weather or environmental change or other alleviating or aggravating factors   Current Medications, Allergies, Complete Past Medical History, Past Surgical History, Family History, and Social History were reviewed in [place] record per todays encounter:  03/21/2015  Review of Systems  Constitutional: Negative.   HENT: Negative.  Negative for ear pain, postnasal drip, rhinorrhea, sinus pressure, sore throat, trouble swallowing and voice change.   Eyes: Negative.   Respiratory: Positive for cough, shortness of breath and wheezing. Negative for apnea, choking, chest tightness and stridor.   Cardiovascular: Negative.  Negative for chest pain, palpitations and leg swelling.  Gastrointestinal: Negative.  Negative for nausea, vomiting, abdominal pain and abdominal distention.  Genitourinary: Negative.     Musculoskeletal: Negative.  Negative for myalgias and arthralgias.  Skin: Negative.  Negative for rash.  Allergic/Immunologic: Negative.  Negative for environmental allergies and food allergies.  Neurological: Negative.  Negative for dizziness, syncope, weakness and headaches.  Hematological: Negative.  Negative for adenopathy. Does not bruise/bleed easily.  Psychiatric/Behavioral: Negative.  Negative for sleep disturbance and agitation. The patient is not nervous/anxious.        Objective:   Physical Exam Filed Vitals:   03/21/15 1055 03/21/15 1100  BP: 144/60   Pulse: 63 59  Temp: 96 F (35.6 C)   TempSrc: Oral   Height: 5\' 7"  (1.702 m)   Weight: 170 lb 12.8 oz (77.474 kg)   SpO2: 73% 91%    Gen: Pleasant, well-nourished, in no distress,  normal affect  ENT: No lesions,  mouth clear,  oropharynx clear, no postnasal drip  Neck: No JVD, no TMG, no carotid bruits  Lungs: No use of accessory muscles, no dullness to percussion, dry rales at bases, distant BS  Cardiovascular: RRR, heart sounds normal, no murmur or gallops, no peripheral edema  Abdomen: soft and NT, no HSM,  BS normal  Musculoskeletal: No deformities, no cyanosis or clubbing  Neuro: alert, non focal  Skin: Warm, no lesions or rashes  No results found.        Assessment & Plan:  I personally reviewed all images and lab data in the [facility] system as well as any outside material available during this office visit and agree with the  radiology impressions.   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  Diagnoses and all orders for this visit:  Chronic respiratory failure with hypoxia Orders: -     AMB REFERRAL FOR DME  DNR (do not resuscitate)  Centrilobular  emphysema  ILD (interstitial lung disease)  COPD with chronic bronchitis gold D Copd     I had an extended discussion with the patient and or family lasting 10 minutes of a 25 minute visit including:  Disease state, prognosis

## 2015-03-21 NOTE — Patient Instructions (Signed)
We will get you a portable tank for continuous 6Liters with exertion  Keep out of hospital DNR form near you No other changes

## 2015-03-21 NOTE — Telephone Encounter (Signed)
Dr. Joya Gaskins, Patient's chart shows that he is at 4L at rest and was increased to 6L on exertion today.  Remo Lipps from [facility] and said that their records indicate patient is on 2L at rest.  Should patient continue on 4L or 2L at rest and does patient need concentrator to support 6L or more.

## 2015-03-21 NOTE — Telephone Encounter (Signed)
Spoke with Remo Lipps at [facility], he wants to verify if patient is going to need 6L continuous 24/7, will he need concentrator?  If so, needs new order placed stating needs continuous 24/7 and concentrator.    To [employer]

## 2015-03-22 NOTE — Telephone Encounter (Signed)
Called spoke with Remo Lipps. Made aware. Order placed for new O2 settings as well. Nothing further needed

## 2015-03-22 NOTE — Telephone Encounter (Signed)
4L rest 6L  Exertion is new dose

## 2015-03-22 NOTE — Assessment & Plan Note (Signed)
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

## 2015-03-23 ENCOUNTER — Ambulatory Visit: Payer: Medicare Other | Admitting: Cardiology

## 2015-03-31 ENCOUNTER — Other Ambulatory Visit: Payer: Self-pay | Admitting: Physician Assistant

## 2015-05-09 ENCOUNTER — Encounter: Payer: Self-pay | Admitting: Critical Care Medicine

## 2015-05-09 ENCOUNTER — Ambulatory Visit (INDEPENDENT_AMBULATORY_CARE_PROVIDER_SITE_OTHER): Payer: Medicare Other | Admitting: Critical Care Medicine

## 2015-05-09 VITALS — BP 138/62 | HR 56 | Temp 96.8°F | Ht 67.0 in | Wt 168.0 lb

## 2015-05-09 DIAGNOSIS — J432 Centrilobular emphysema: Secondary | ICD-10-CM

## 2015-05-09 DIAGNOSIS — J449 Chronic obstructive pulmonary disease, unspecified: Secondary | ICD-10-CM

## 2015-05-09 DIAGNOSIS — J9611 Chronic respiratory failure with hypoxia: Secondary | ICD-10-CM

## 2015-05-09 DIAGNOSIS — J849 Interstitial pulmonary disease, unspecified: Secondary | ICD-10-CM

## 2015-05-09 DIAGNOSIS — Z515 Encounter for palliative care: Secondary | ICD-10-CM

## 2015-05-09 DIAGNOSIS — Z66 Do not resuscitate: Secondary | ICD-10-CM

## 2015-05-09 NOTE — Progress Notes (Signed)
Subjective:    Patient ID: Manuel [name], male    DOB: [birth year], 81 y.o.   MRN: [id number]  HPI 05/09/2015 Chief Complaint  Patient presents with  . Follow-up    sob not any better. Almost passed out 2 times since last ov (last time last wk.).Energy level worse. Dry cough,occass. wheezing.Midchest pain ocass., no fcs.Wearing 6L pulsing portable system.Oxygen 82% 6L pulsing not breathing thru nose,reminded to and increased to 90% 6L pulsing.  End stage Copd gold D .  Oxygen dependent No real mucus.   Notes some chest pain: notes pressure and tightness.  Notes some edema in feet. And weight is up.   The patient continues to decline  Current Medications, Allergies, Complete Past Medical History, Past Surgical History, Family History, and Social History were reviewed in [place] record per todays encounter:  05/09/2015   Review of Systems  Constitutional: Positive for fatigue.  HENT: Negative.  Negative for ear pain, postnasal drip, rhinorrhea, sinus pressure, sore throat, trouble swallowing and voice change.   Eyes: Negative.   Respiratory: Positive for cough, shortness of breath and wheezing. Negative for apnea, choking, chest tightness and stridor.   Cardiovascular: Negative.  Negative for chest pain, palpitations and leg swelling.  Gastrointestinal: Negative.  Negative for nausea, vomiting, abdominal pain and abdominal distention.  Genitourinary: Negative.   Musculoskeletal: Negative.  Negative for myalgias and arthralgias.  Skin: Negative.  Negative for rash.  Allergic/Immunologic: Negative.  Negative for environmental allergies and food allergies.  Neurological: Negative.  Negative for dizziness, syncope, weakness and headaches.  Hematological: Negative.  Negative for adenopathy. Does not bruise/bleed easily.  Psychiatric/Behavioral: Negative.  Negative for sleep disturbance and agitation. The patient is not nervous/anxious.        Objective:   Physical Exam Filed Vitals:   05/09/15 1107 05/09/15 1110  BP: 138/62   Pulse: 55 56  Temp: 96.8 F (36 C)   TempSrc: Oral   Height: 5\' 7"  (1.702 m)   Weight: 168 lb (76.204 kg)   SpO2: 86% 90%    Gen: Elderly male sitting in wheelchair, in no distress,  normal affect  ENT: No lesions,  mouth clear,  oropharynx clear, no postnasal drip  Neck: No JVD, no TMG, no carotid bruits  Lungs: No use of accessory muscles, no dullness to percussion, dry rales, distant bs  Cardiovascular: RRR, heart sounds normal, no murmur or gallops, no peripheral edema  Abdomen: soft and NT, no HSM,  BS normal  Musculoskeletal: No deformities, no cyanosis or clubbing  Neuro: alert, non focal  Skin: Warm, no lesions or rashes  No results found.        Assessment & Plan:  I personally reviewed all images and lab data in the [facility] system as well as any outside material available during this office visit and agree with the  radiology impressions.   COPD with chronic bronchitis gold D Copd  Gold D COPD oxygen dependent with associated pulmonary fibrosis end-stage Plan Discussed plan of care with the patient family and Dr. Delena Bali Continue nebulized therapy as prescribed Obtain a wheelchair for the patient Obtain portable oxygen that will go to 6 L continuous Consideration for hospice care was discussed patient does not seem to be ready for this but will give this consideration further and discuss with primary care   [name] was seen today for follow-up.  Diagnoses and all orders for this visit:  Chronic respiratory failure with hypoxia -     AMB  REFERRAL FOR DME  Centrilobular emphysema -     AMB REFERRAL FOR DME  ILD (interstitial lung disease) -     AMB REFERRAL FOR DME  COPD with chronic bronchitis gold D Copd

## 2015-05-09 NOTE — Patient Instructions (Signed)
Use oxygen 6Liters continuous at exertion Use wheelchair to get out of home No change in medications Will discuss hospice care with dr Carolanne Grumbling for future consideration

## 2015-05-10 NOTE — Assessment & Plan Note (Signed)
Gold D COPD oxygen dependent with associated pulmonary fibrosis end-stage Plan Discussed plan of care with the patient family and Dr. Delena Bali Continue nebulized therapy as prescribed Obtain a wheelchair for the patient Obtain portable oxygen that will go to 6 L continuous Consideration for hospice care was discussed patient does not seem to be ready for this but will give this consideration further and discuss with primary care

## 2015-06-09 IMAGING — CR DG CHEST 2V
2 series · 2 of 2 positions shown · non-contrast
Comparison: Chest x-ray of 12/29/2014 and CT chest of 12/28/2014

CLINICAL DATA: History of congestive heart failure and pneumonia 1
month ago, shortness of breath

EXAM:
CHEST  2 VIEW

[view not recorded (1 of 2)]
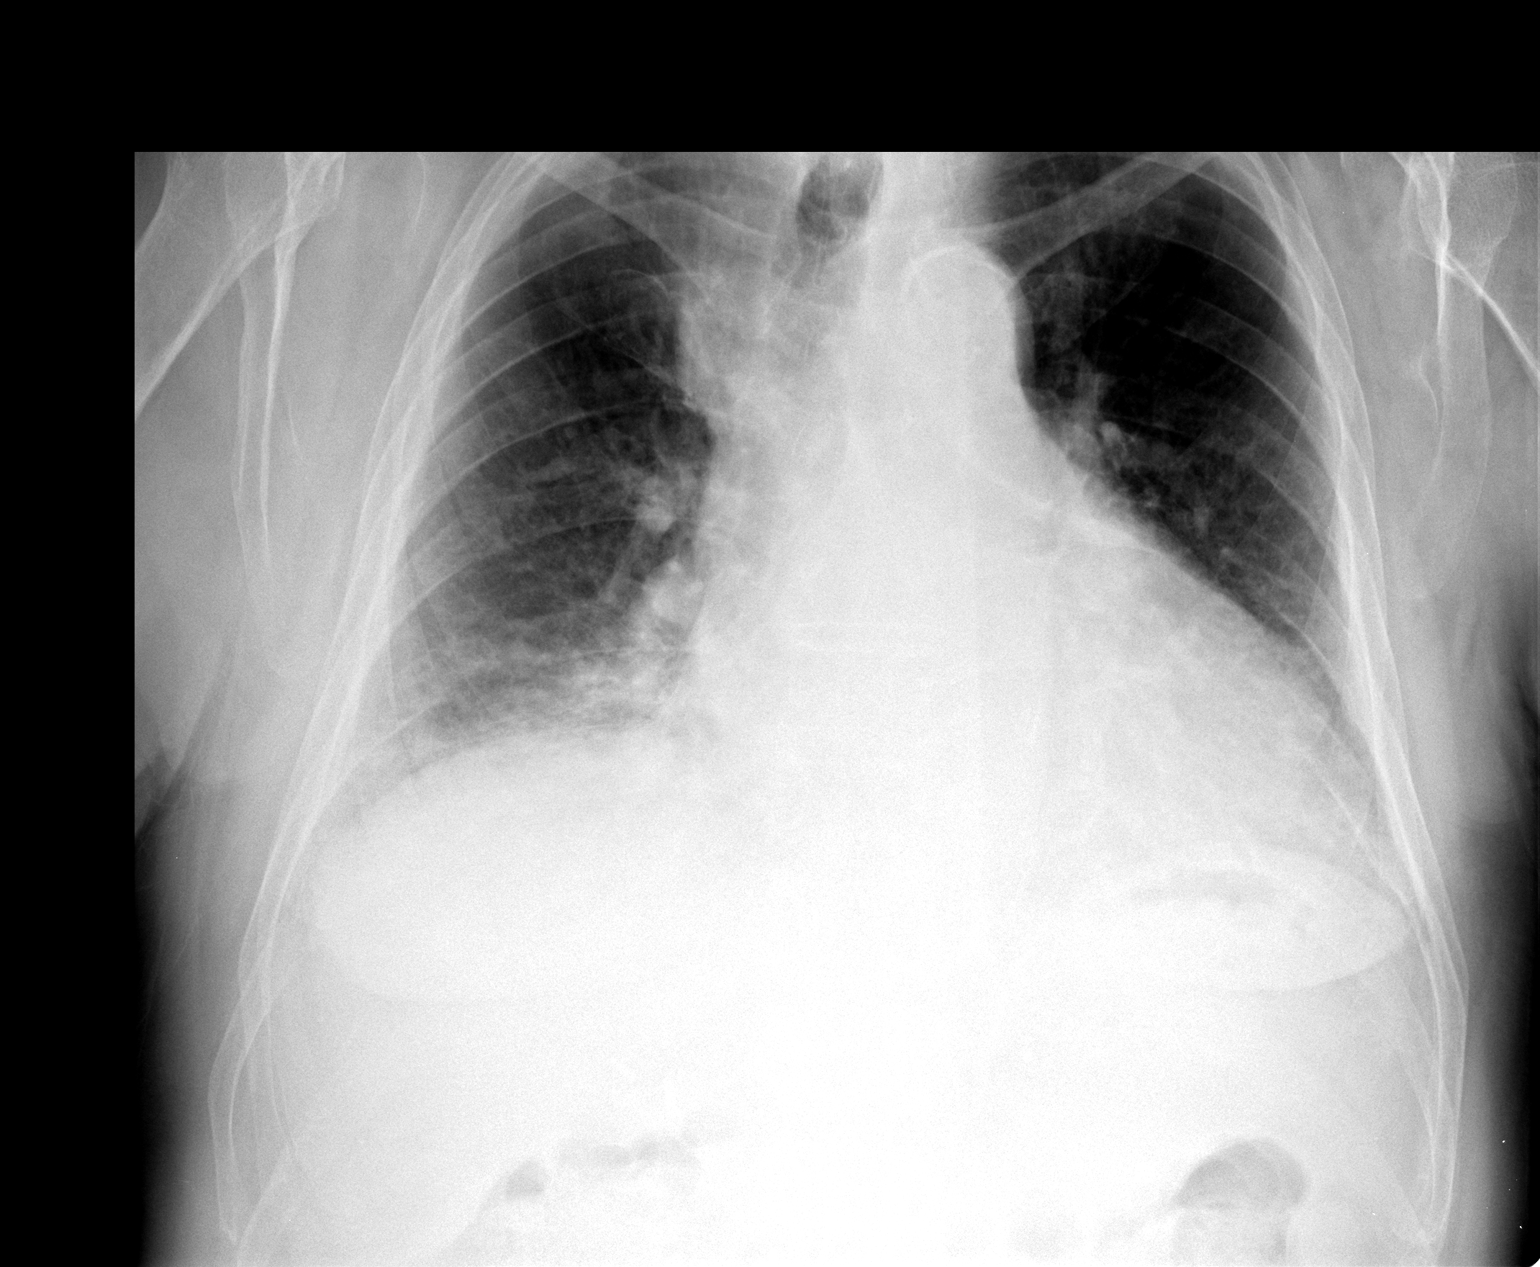

[view not recorded (2 of 2)]
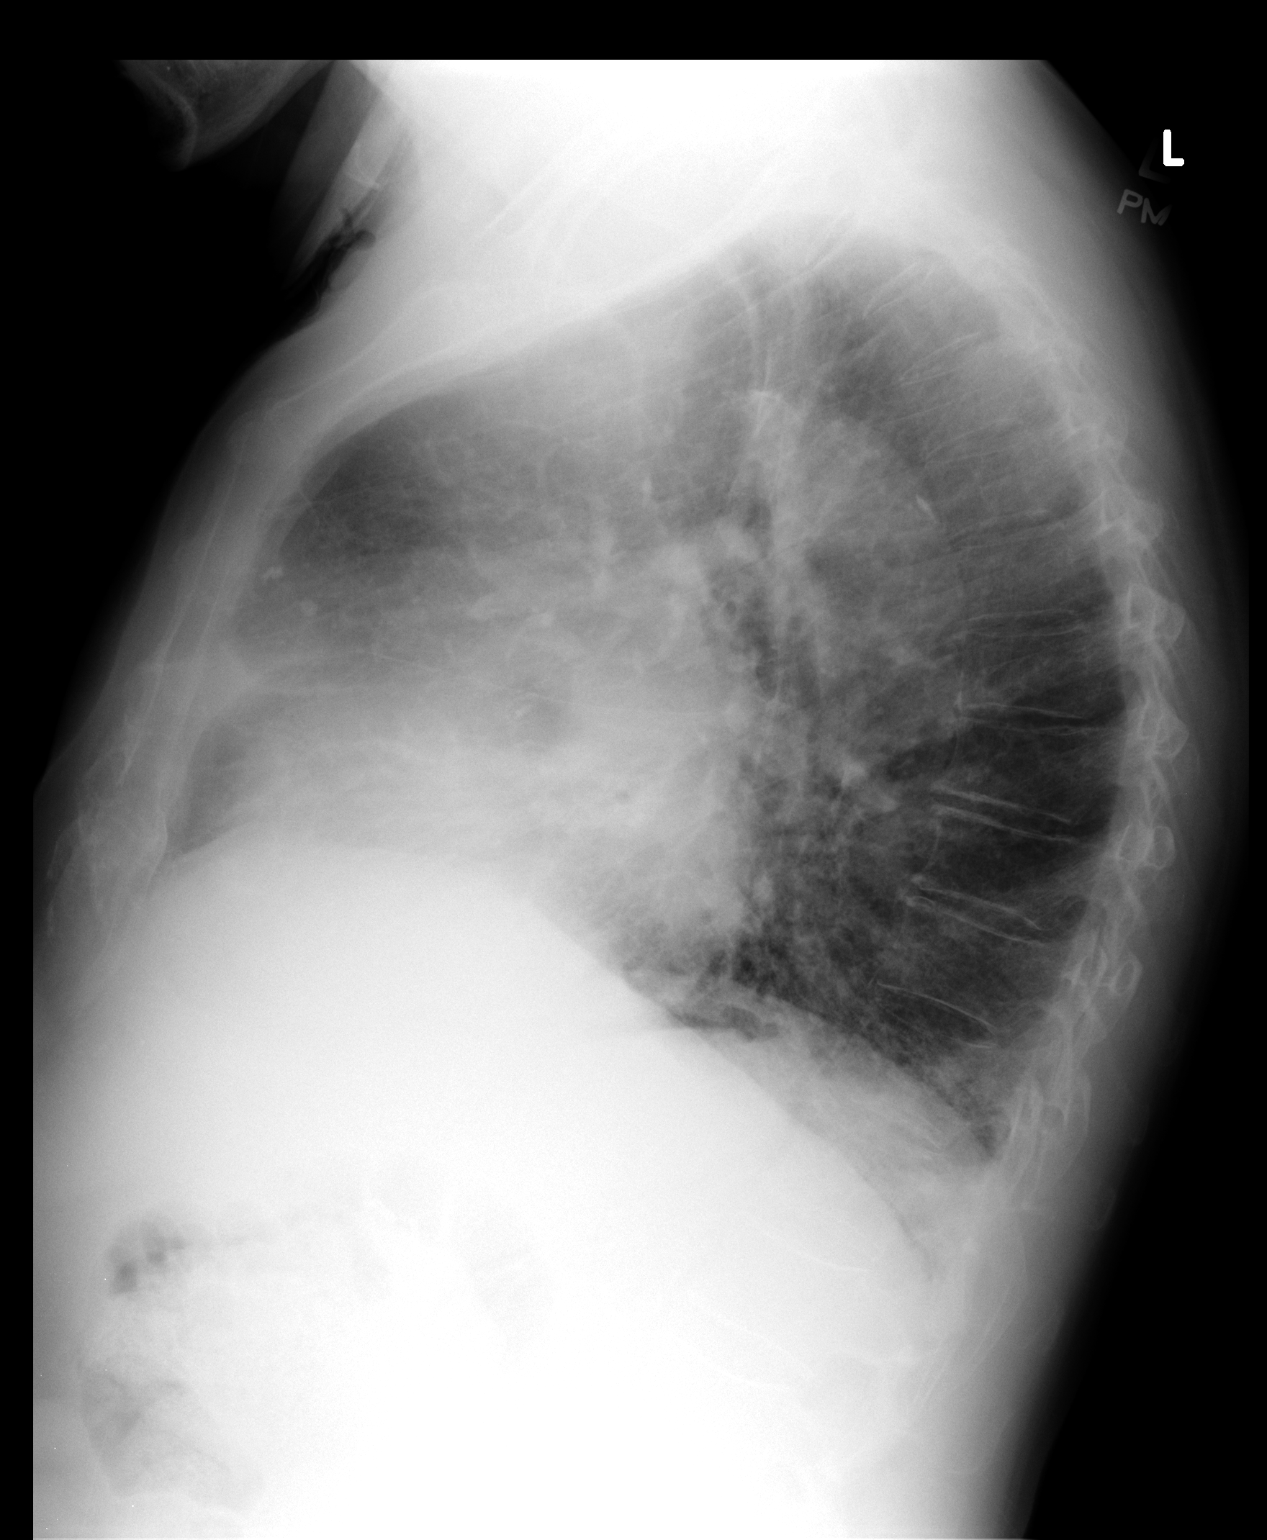

[2 of 2 positions shown; findings below may reference images not displayed]

FINDINGS: Prominent markings at the lung bases are most consistent with
basilar fibrosis when compared to the recent CT scan of the chest.
The lungs remain hyperaerated. No definite pneumonia or effusion is
seen. Moderate cardiomegaly is stable. The bones are osteopenic.
IMPRESSION: 1. No definite pneumonia or effusion.
2. Bibasilar fibrosis.
3. Stable cardiomegaly.

## 2015-10-25 DEATH — deceased
